# Patient Record
Sex: Male | Born: 1980 | Hispanic: No | Marital: Married | Smoking: Current every day smoker
Health system: Southern US, Community
[De-identification: ages and names within clinical notes are randomized; demographics above are authoritative.]

## PROBLEM LIST (undated history)

## (undated) HISTORY — PX: APPENDECTOMY: SHX54

---

## 2001-06-09 ENCOUNTER — Emergency Department (HOSPITAL_COMMUNITY): Admission: EM | Admit: 2001-06-09 | Discharge: 2001-06-09 | Payer: Self-pay | Admitting: Emergency Medicine

## 2001-06-09 ENCOUNTER — Encounter: Payer: Self-pay | Admitting: Emergency Medicine

## 2002-04-21 ENCOUNTER — Encounter: Payer: Self-pay | Admitting: Emergency Medicine

## 2002-04-21 ENCOUNTER — Inpatient Hospital Stay (HOSPITAL_COMMUNITY): Admission: EM | Admit: 2002-04-21 | Discharge: 2002-05-06 | Payer: Self-pay | Admitting: Emergency Medicine

## 2002-04-21 ENCOUNTER — Encounter (INDEPENDENT_AMBULATORY_CARE_PROVIDER_SITE_OTHER): Payer: Self-pay | Admitting: *Deleted

## 2002-04-27 ENCOUNTER — Encounter: Payer: Self-pay | Admitting: General Surgery

## 2003-05-07 ENCOUNTER — Encounter: Payer: Self-pay | Admitting: *Deleted

## 2003-05-07 ENCOUNTER — Emergency Department (HOSPITAL_COMMUNITY): Admission: EM | Admit: 2003-05-07 | Discharge: 2003-05-07 | Payer: Self-pay | Admitting: *Deleted

## 2004-04-04 ENCOUNTER — Emergency Department (HOSPITAL_COMMUNITY): Admission: EM | Admit: 2004-04-04 | Discharge: 2004-04-04 | Payer: Self-pay | Admitting: Emergency Medicine

## 2005-06-28 ENCOUNTER — Emergency Department (HOSPITAL_COMMUNITY): Admission: EM | Admit: 2005-06-28 | Discharge: 2005-06-28 | Payer: Self-pay | Admitting: Emergency Medicine

## 2005-10-19 ENCOUNTER — Emergency Department (HOSPITAL_COMMUNITY): Admission: EM | Admit: 2005-10-19 | Discharge: 2005-10-19 | Payer: Self-pay | Admitting: Emergency Medicine

## 2006-01-31 ENCOUNTER — Emergency Department (HOSPITAL_COMMUNITY): Admission: EM | Admit: 2006-01-31 | Discharge: 2006-01-31 | Payer: Self-pay | Admitting: Family Medicine

## 2007-10-03 ENCOUNTER — Emergency Department (HOSPITAL_COMMUNITY): Admission: EM | Admit: 2007-10-03 | Discharge: 2007-10-03 | Payer: Self-pay | Admitting: Family Medicine

## 2007-10-15 ENCOUNTER — Emergency Department (HOSPITAL_COMMUNITY): Admission: EM | Admit: 2007-10-15 | Discharge: 2007-10-15 | Payer: Self-pay | Admitting: Emergency Medicine

## 2008-08-03 ENCOUNTER — Emergency Department (HOSPITAL_COMMUNITY): Admission: EM | Admit: 2008-08-03 | Discharge: 2008-08-03 | Payer: Self-pay | Admitting: Family Medicine

## 2008-12-20 ENCOUNTER — Emergency Department (HOSPITAL_COMMUNITY): Admission: EM | Admit: 2008-12-20 | Discharge: 2008-12-20 | Payer: Self-pay | Admitting: Family Medicine

## 2009-11-18 ENCOUNTER — Emergency Department (HOSPITAL_COMMUNITY): Admission: EM | Admit: 2009-11-18 | Discharge: 2009-11-18 | Payer: Self-pay | Admitting: Emergency Medicine

## 2010-10-09 ENCOUNTER — Emergency Department (HOSPITAL_COMMUNITY): Admission: EM | Admit: 2010-10-09 | Discharge: 2010-07-20 | Payer: Self-pay | Admitting: Emergency Medicine

## 2010-10-20 ENCOUNTER — Emergency Department (HOSPITAL_COMMUNITY)
Admission: EM | Admit: 2010-10-20 | Discharge: 2010-10-20 | Payer: Self-pay | Source: Home / Self Care | Admitting: Emergency Medicine

## 2010-10-20 ENCOUNTER — Emergency Department (HOSPITAL_COMMUNITY)
Admission: EM | Admit: 2010-10-20 | Discharge: 2010-10-20 | Disposition: A | Discharge status: Other Acute Inpatient Hospital | Payer: Self-pay | Source: Home / Self Care | Admitting: Emergency Medicine

## 2010-10-22 ENCOUNTER — Emergency Department (HOSPITAL_COMMUNITY)
Admission: EM | Admit: 2010-10-22 | Discharge: 2010-10-22 | Payer: Self-pay | Source: Home / Self Care | Admitting: Emergency Medicine

## 2011-01-15 LAB — CBC
MCH: 33.4 pg (ref 26.0–34.0)
MCHC: 36 g/dL (ref 30.0–36.0)
MCV: 92.9 fL (ref 78.0–100.0)
Platelets: 219 10*3/uL (ref 150–400)
RBC: 4.49 MIL/uL (ref 4.22–5.81)

## 2011-01-15 LAB — DIFFERENTIAL
Basophils Absolute: 0 10*3/uL (ref 0.0–0.1)
Lymphocytes Relative: 26 % (ref 12–46)
Neutro Abs: 6 10*3/uL (ref 1.7–7.7)

## 2011-01-15 LAB — COMPREHENSIVE METABOLIC PANEL
Albumin: 3.9 g/dL (ref 3.5–5.2)
BUN: 8 mg/dL (ref 6–23)
Chloride: 104 mEq/L (ref 96–112)
Creatinine, Ser: 1.04 mg/dL (ref 0.4–1.5)
GFR calc non Af Amer: >60 mL/min (ref >60)
Total Bilirubin: 0.4 mg/dL (ref 0.3–1.2)

## 2011-01-15 LAB — POCT CARDIAC MARKERS
CKMB, poc: <1 ng/mL — ABNORMAL LOW (ref 1.0–8.0)
Troponin i, poc: <0.05 ng/mL (ref 0.00–0.09)

## 2011-03-20 NOTE — Discharge Summary (Signed)
NAME:  Jared Humphrey, Jared Humphrey NO.:  [id number]   MEDICAL RECORD NO.:  [id number]                   PATIENT TYPE:  INP   LOCATION:  5738                                 FACILITY:  [facility]   PHYSICIAN:  Adolph Pollack, M.D.            DATE OF BIRTH:  Aug 16, 1981   DATE OF ADMISSION:  04/21/2002  DATE OF DISCHARGE:  05/06/2002                                 DISCHARGE SUMMARY   PRINCIPAL DISCHARGE DIAGNOSIS:  Acute perforated appendicitis.   SECONDARY DIAGNOSES:  1. Postoperative intra-abdominal abscess.  2. Gastroesophageal reflux.   PROCEDURE:  1. Laparoscopic appendectomy April 21, 2002.  2. Exploratory laparotomy and drainage of intra-abdominal abscesses April 27, 2002.   REASON FOR ADMISSION:  The patient is a 30 year old male who the morning  before admission began developing periumbilical crampy-type pain that  radiated down to the right lower quadrant.  It progressively worsened.  He  hesitated to come into the hospital but eventually the pain got bad enough  that he did come to the hospital to seek attention.  He was seen by the  emergency department physician and the CT scan demonstrated findings looking  consistent with an appendicitis and he was admitted.   HOSPITAL COURSE:  He was taken to the operating room.  He was found to have  a ruptured appendix with a purulent pelvic fluid during his laparoscopic  appendectomy.  The cavity was irrigated copiously.  He was maintained on IV  antibiotics.  He had a fair amount of pain afterwards but began to improve;  in fact, he left the unit to smoke multiple times.  He remained afebrile  with a normal white blood cell count however, his sixth postoperative day he  began spiking fevers again.  CT scan demonstrated multiple areas of  loculated fluid collections consistent with abscess and his white blood cell  count was noted to be elevated.  He subsequently was taken back to the  operating room April 27, 2002 where multiple intra-abdominal abscesses were  drained and three #19 Blake drains were placed.  Postoperatively again, he  was maintained on IV Zosyn.  His wound was left to heal by secondary  intention and dressing changes were started.  He began feeling better and  having less pain.  He defervesced.  He began having bowel movements and he  had a regular diet.  His cultures were sterile with respect to growth;  however, he improved dramatically after the second operation.  We slowly  started removing his drains until on May 06, 2002 the drains were all out,  wound was healing well and he was able to be discharged.   DISPOSITION:  Discharged home May 06, 2002.  He was taught how to change his  dressings and he felt comfortable with this.  He was given Tylox for pain,  Augmentin for antibiotic.  He is due to see me in a  few days for followup  appointment.  He was told to call for any problems.                                                Adolph Pollack, M.D.    Kari Baars  D:  05/30/2002  T:  06/02/2002  Job:  [id number]

## 2011-03-20 NOTE — Op Note (Signed)
[place]. [facility]  Patient:    Jared Humphrey, Jared Humphrey Visit Number: [id number] MRN: [id number]          Service Type: MED Location: 5000 [id number] Attending Physician:  Arlis Porta Dictated by:   Adolph Pollack, M.D. Proc. Date: 04/21/02 Admit Date:  04/21/2002                             Operative Report  PREOPERATIVE DIAGNOSIS:  Acute appendicitis.  POSTOPERATIVE DIAGNOSIS:  Acute perforated appendicitis.  PROCEDURE:  Laparoscopic appendectomy.  SURGEON: Adolph Pollack, M.D.  ANESTHESIA:  General.  INDICATION:  Mr. Strine is a 30 year old male who began having abdominal pain yesterday.  The pain presented in his periumbilical area, then lower abdomen, and now mostly in the right lower quadrant.  He presented to the emergency department, where he was noted to have a high white blood count, and a CT scan demonstrated findings consistent with acute appendicitis.  He now presents for elective appendectomy.  DESCRIPTION OF PROCEDURE:  He was placed supine on the operating table, and a general anesthetic was administered.  A Foley catheter was placed in the bladder.  The abdomen was shaved and sterilely prepped and draped.  A subumbilical incision was made, incising the skin sharply and the subcutaneous tissue divided bluntly.  The midline fascia was identified and a small incision made in that.  The peritoneal cavity was then entered bluntly under direct vision.  A pursestring suture of 0 Vicryl was placed around the fascial edges.  A Hasson trocar was introduced into the peritoneal cavity and pneumoperitoneum created by insufflation of CO2 gas.  Next the laparoscope was introduced.  There was purulent fluid noted in the pelvis.  This made me suspicious for a small perforation.  Under direct vision a 5 mm trocar was placed in the lower midline, and I began manipulating the cecal area and noted an inflamed, indurated appendix  with gangrenous-type changes and fibrinous debris.  I felt these findings were probably consistent with a microperforation.  I then used blunt dissection to carefully mobilized this free of the ileum and the lateral sidewall.  I then placed a 5 mm trocar in the right upper quadrant, grasped the mesoappendix, and retracted it anteriorly.  I used the Harmonic scalpel to divide the mesoappendix down to the base of the appendix.  The appendix was then amputated from the cecum using the Endo-GIA stapler.  The staple line was hemostatic and solid.  I placed the appendix in the Endopouch bag and on initial attempts to try to bring it out through the subumbilical port, the bag broke.  I retrieved the appendix and put it into a second Endopouch bag and was able to remove it at this time.  I subsequently copiously irrigated out the abdominal cavity with 1.8 L of normal saline solution and evacuated it.  I re-examined the cecal area and again the staple line was solid without bleeding.  Under direct vision I then removed the Hasson trocar and closed the fascial defect by tightening up and tying down the pursestring suture.  The pneumoperitoneum was released and the remaining trocars removed.  The skin incisions were then closed with 4-0 Monocryl subcuticular stitches. Side-to-side and sterile dressings were applied.  He tolerated the procedure well without any apparent complications.  He was taken to recovery in satisfactory condition. Dictated by:   Adolph Pollack, M.D. Attending Physician:  Arlis Porta DD:  04/21/02 TD:  04/24/02 Job: [id number]

## 2011-03-20 NOTE — H&P (Signed)
[place]. [facility]  Patient:    Jared Humphrey, Jared Humphrey Visit Number: [id number] MRN: [id number]          Service Type: MED Location: 5000 5002 [id number] Attending Physician:  Arlis Porta Dictated by:   Adolph Pollack, M.D. Admit Date:  04/21/2002                           History and Physical  CHIEF COMPLAINT: Abdominal pain.  HISTORY OF PRESENT ILLNESS: Jared Humphrey is a 30 year old male, who mid morning yesterday began developing some periumbilical crampy type pain that radiated to the lower quadrants and then down to the right lower quadrant. The pain persisted and got a little worse.  He made himself throw up but got no relief.  He had had no diarrhea and no dysuria.  The pain continued to increase and he presented to the emergency department for evaluation.  His evaluation included a CT scan of his abdomen, with findings consistent with appendicitis, and I was called to see him because of this.  PAST MEDICAL HISTORY: Gastroesophageal reflux.  PAST SURGICAL HISTORY: Left knee surgery.  ALLERGIES: None known.  MEDICATIONS: Prilosec p.r.n.  SOCIAL HISTORY: He is single.  He smokes a pack of cigarettes a day.  He denies tobacco use.  Self-employed.  FAMILY HISTORY: Noncontributory.  REVIEW OF SYSTEMS: CARDIOVASCULAR: No known heart disease or hypertension. PULMONARY: Denies asthma, pneumonia.  GI: Denies hepatitis, peptic ulcer disease, diverticulitis.  GU: Denies kidney stones.  ENDOCRINE: Denies diabetes.  HEMATOLOGIC: Denies bleeding disorders, transfusions.  NEUROLOGIC: Denies seizure disorder.  PHYSICAL EXAMINATION:  GENERAL: Slightly overweight male in no acute distress.  Pleasant and cooperative.  VITAL SIGNS: Temperature at 10:20 a.m. was 99.3 degrees with a pulse of 83 and blood pressure of 148/72.  SKIN: Warm and dry.  No rash.  HEENT: Normocephalic, atraumatic.  EOMI.  Sclerae clear.  NECK: Supple without palpable  masses.  CARDIOVASCULAR: Heart demonstrates regular rate and rhythm with no murmur.  RESPIRATORY: Breath sounds equal and clear.  Respirations unlabored.  ABDOMEN: Soft.  There is right lower quadrant tenderness to palpation, percussion with some mild guarding.  Active bowel sounds are noted.  No masses noted.  EXTREMITIES: No cyanosis or edema and full range of motion.  LABORATORY DATA: WBC 20,200, hemoglobin 14.9, platelet count 221,000.  UA was negative.  CMET is remarkable for a total bilirubin of 1.5.  IMPRESSION: Acute appendicitis.  PLAN: IV antibiotics and laparoscopic appendectomy.  I have explained the procedure and the risks to him.  Risks include, but not limited to, bleeding, infection, accidental damage to intra-abdominal organs such as intestine or bladder, and risks of anesthesia.  He seems to understand this and agrees to proceed. Dictated by:   Adolph Pollack, M.D. Attending Physician:  Arlis Porta DD:  04/21/02 TD:  04/24/02 Job: [id number]

## 2011-03-20 NOTE — Op Note (Signed)
. [facility]  Patient:    Jared Humphrey, Jared Humphrey Visit Number: [id number] MRN: [id number]          Service Type: MED Location: [id number] Attending Physician:  Arlis Porta Dictated by:   Adolph Pollack, M.D. Proc. Date: 04/27/02 Admit Date:  04/21/2002                             Operative Report  PREOPERATIVE DIAGNOSIS:  Intra-abdominal abscesses.  POSTOPERATIVE DIAGNOSIS:  Intra-abdominal abscesses.  PROCEDURE:  Exploratory laparotomy and drainage of intra-abdominal abscesses.  SURGEON:  Adolph Pollack, M.D.  ANESTHESIA:  General.  INDICATIONS:  The patient is a 30 year old male who underwent a laparoscopic appendectomy for a perforated appendicitis.  He had a slow postoperative course, but was beginning to get better until yesterday when he had increasing pain, fever, and then elevation of his white blood cell count today.  CT scan demonstrated multiple fluid collections which were loculated consistent with abscesses.  These were not amenable to percutaneous drainage.  He is now brought to the operating room for exploration and open drainage.  FINDINGS:  There were approximately four pockets of bloody thin fluid.  One in the right lower quadrant and one in the left lower quadrant, one in the pelvis, and one in an interloop area of the small intestine.  There was fibrinous debris all through the small intestine and in the right lower quadrant area.  Cultures were sent.  DESCRIPTION OF PROCEDURE:  He was placed supine on the operating table and a general anesthetic was administered.  A Foley catheter was placed in the bladder.  His abdomen was sterilely prepped and draped.  A lower midline incision was made, incising the skin and subcutaneous tissue down to the level of the fascia.  The midline fascia was then divided with the cautery.  The peritoneal cavity was entered under direct vision.  Upon entering into  the peritoneal cavity, I placed my hand into the right lower quadrant and evacuated a lot of thin bloody-type fluid.  Fibrinous debris and inflammatory changes was noted.  I subsequently placed my hand just anterior to the rectum, and again, drained a fair amount of thin bloody fluid.  I placed my hand in the left lower quadrant, drained a thin amount of bloody fluid.  I sent this fluid for culture.  Next, I noticed fibrinous debris, and interloop fluid collections between the small intestine and the lower abdomen which I broke up.  I debrided some of the fibrinous debris from the small intestine.  I examined the appendiceal stump and there was no leak from this.  There was no bile injury noted.  Next, I copiously irrigated out the abdomen with 4.5 L of saline solution.  I then made two stab wounds in the right lower quadrant.  I placed a 19 Blake drain into the right lower quadrant pericecal area.  I anchored into the skin with a 3-0 nylon suture.  I then placed a second 19 Blake drain in the retropubic space just anterior to the rectum.  I anchored into a skin with a 3-0 nylon suture.  I then made a stab wound in the left lower quadrant and placed a 19 Blake drain in the left lower quadrant area and the left gutter. I anchored it to the skin with a 3-0 Vicryl suture.  Next, when sponge, instrument, and needle counts were reported to  be correct, I closed the fascia with a running #1 PDS suture.  The subcutaneous tissue was irrigated with antibiotic solution.  I then partially closed the skin with staples, and then packed the rest of the wound open.  Bulky dressings were applied.  He tolerated the procedure well without any apparent complications.  He was taken to recovery room in satisfactory condition. Dictated by:   Adolph Pollack, M.D. Attending Physician:  Arlis Porta DD:  04/27/02 TD:  04/30/02 Job: [id number]

## 2011-11-09 ENCOUNTER — Encounter: Payer: Self-pay | Admitting: Emergency Medicine

## 2011-11-09 ENCOUNTER — Other Ambulatory Visit: Payer: Self-pay

## 2011-11-09 ENCOUNTER — Emergency Department (HOSPITAL_COMMUNITY): Payer: Self-pay

## 2011-11-09 ENCOUNTER — Emergency Department (HOSPITAL_COMMUNITY)
Admission: EM | Admit: 2011-11-09 | Discharge: 2011-11-09 | Disposition: A | Discharge status: Home / Self Care | Payer: Self-pay | Attending: Emergency Medicine | Admitting: Emergency Medicine

## 2011-11-09 DIAGNOSIS — R1110 Vomiting, unspecified: Secondary | ICD-10-CM | POA: Insufficient documentation

## 2011-11-09 DIAGNOSIS — M7989 Other specified soft tissue disorders: Secondary | ICD-10-CM | POA: Insufficient documentation

## 2011-11-09 DIAGNOSIS — R000 Tachycardia, unspecified: Secondary | ICD-10-CM | POA: Insufficient documentation

## 2011-11-09 DIAGNOSIS — M79609 Pain in unspecified limb: Secondary | ICD-10-CM | POA: Insufficient documentation

## 2011-11-09 DIAGNOSIS — R079 Chest pain, unspecified: Secondary | ICD-10-CM | POA: Insufficient documentation

## 2011-11-09 NOTE — ED Notes (Signed)
Drinda Butts unable to take report at present

## 2011-11-09 NOTE — ED Provider Notes (Signed)
History     CSN: [id number]  Arrival date & time 11/09/11  8413   First MD Initiated Contact with Patient 11/09/11 0805      Chief Complaint  Patient presents with  . Chest Pain    (Consider location/radiation/quality/duration/timing/severity/associated sxs/prior treatment) HPI  31 year old male presenting to the ED with chief complaints of chest pain. Patient describes sharp, pleuritic chest pain which wakes him up this morning. He vomited once due to pain. Patient states pain is now achy, worsening with deep breath.  Pain resides in his left anterior chest, and nonradiating.  Patient denies fever, cough, abdominal pain, diarrhea, dysuria, recent trauma. He denies alcohol or recreational drug use. Patient mentioned he has history of heart palpitation. Does have a family history of heart disease. Patient also mention that he has been noticing pain and swelling to his right lower extremities as compared to left lower extremities. However, he has noticed it for the past year. He denies any recent surgery, prolonged travel, or prolonged bed rest.  No past medical history on file.  No past surgical history on file.  No family history on file.  History  Substance Use Topics  . Smoking status: Not on file  . Smokeless tobacco: Not on file  . Alcohol Use: Not on file      Review of Systems  All other systems reviewed and are negative.    Allergies  Review of patient's allergies indicates no known allergies.  Home Medications   Current Outpatient Rx  Name Route Sig Dispense Refill  . IBUPROFEN 200 MG PO TABS Oral Take 400 mg by mouth every 6 (six) hours as needed. For pain       BP 137/75  Pulse 100  Temp(Src) 98.8 F (37.1 C) (Oral)  Resp 20  Ht 6\' 2"  (1.88 m)  Wt 285 lb (129.275 kg)  BMI 36.59 kg/m2  SpO2 96%  Physical Exam  Nursing note and vitals reviewed. Constitutional: He appears well-developed and well-nourished. No distress.       Awake, alert, nontoxic  appearance  HENT:  Head: Normocephalic and atraumatic.  Eyes: Right eye exhibits no discharge. Left eye exhibits no discharge.  Neck: Neck supple.  Cardiovascular: Tachycardia present.  Exam reveals no gallop and no friction rub.   No murmur heard. Pulmonary/Chest: Effort normal and breath sounds normal. No respiratory distress. He has no wheezes. He has no rales. He exhibits no tenderness.    Abdominal: Soft. There is no tenderness. There is no rebound.  Musculoskeletal: He exhibits no tenderness.       Baseline ROM, no obvious new focal weakness  Neurological:       Mental status and motor strength appears baseline for patient and situation  Skin: No rash noted.  Psychiatric: He has a normal mood and affect.    ED Course  Procedures (including critical care time)  Labs Reviewed - No data to display No results found.   No diagnosis found.   Date: 11/09/2011  Rate: 106  Rhythm: sinus tachycardia  QRS Axis: normal  Intervals: normal  ST/T Wave abnormalities: normal  Conduction Disutrbances:none  Narrative Interpretation:   Old EKG Reviewed: unchanged    MDM  Patient presents with signs and symptoms concerning for PE. I will obtain EKG, 2 sets of troponins, d-dimer, and chest x-ray for further evaluation. Patient is currently in no acute distress. He is afebrile. He is mildly tachycardic, with increased respiratory rate but in no acute respiratory distress.  9:29 AM  Chest x-ray shows bronchitic changes without obvious evidence of pneumonia,  Or other acute changes.  Patient has a negative d-dimer. Although patient complains of lower extremity pain and swelling, he also mention that his symptom has been ongoing for the past year. It is unlikely that he has a DVT at this time. Patient will be sent to the CDU while waiting for a second set of cardiac enzymes. Cardiology will consult for outpatient followup. I discussed patient's care with my attending has seen and evaluate  patient and agree with plan.  Trixie Dredge, PA-C will continue further pt care in CDU.    11:15 AM I have consulted with [facility] to set up a follow up appointment. [facility] will call pt to set up follow up.      Fayrene Helper, PA 11/09/11 0934    Fayrene Helper, PA 11/09/11 1116

## 2011-11-09 NOTE — ED Notes (Signed)
Pt reports he was awoken out of his sleep this AM around 545 with L sided chest pain, sharp in nature. Denies radiation. Vomited x1 this AM.

## 2011-11-09 NOTE — ED Provider Notes (Signed)
Medical screening examination/treatment/procedure(s) were conducted as a shared visit with non-physician practitioner(s) and myself.  I personally evaluated the patient during the encounter  Doug Sou, MD 11/09/11 1221

## 2011-11-09 NOTE — ED Provider Notes (Signed)
Complains of skipped heartbeats for the past several weeks, today patient awakened with sharp pleuritic chest pain covering approximately 3 cm area under left breast nonradiating pain worse with deep inspiration not improved by anything constant mild at present also complains of swelling and pain in right calf for past several weeks on exam alert nontoxic Glasgow Coma Score 15 lungs clear to auscultation heart regular rate and rhythm no murmurs abdomen nondistended nontender extremities without edema no cords no Homans sign neurovascular  Doug Sou, MD 11/09/11 1740

## 2013-03-07 IMAGING — CR DG CHEST 2V
2 series · 2 of 2 positions shown · non-contrast
Comparison: 07/20/2010

CLINICAL DATA: Chest pain.

CHEST - 2 VIEW

[w chest pa]
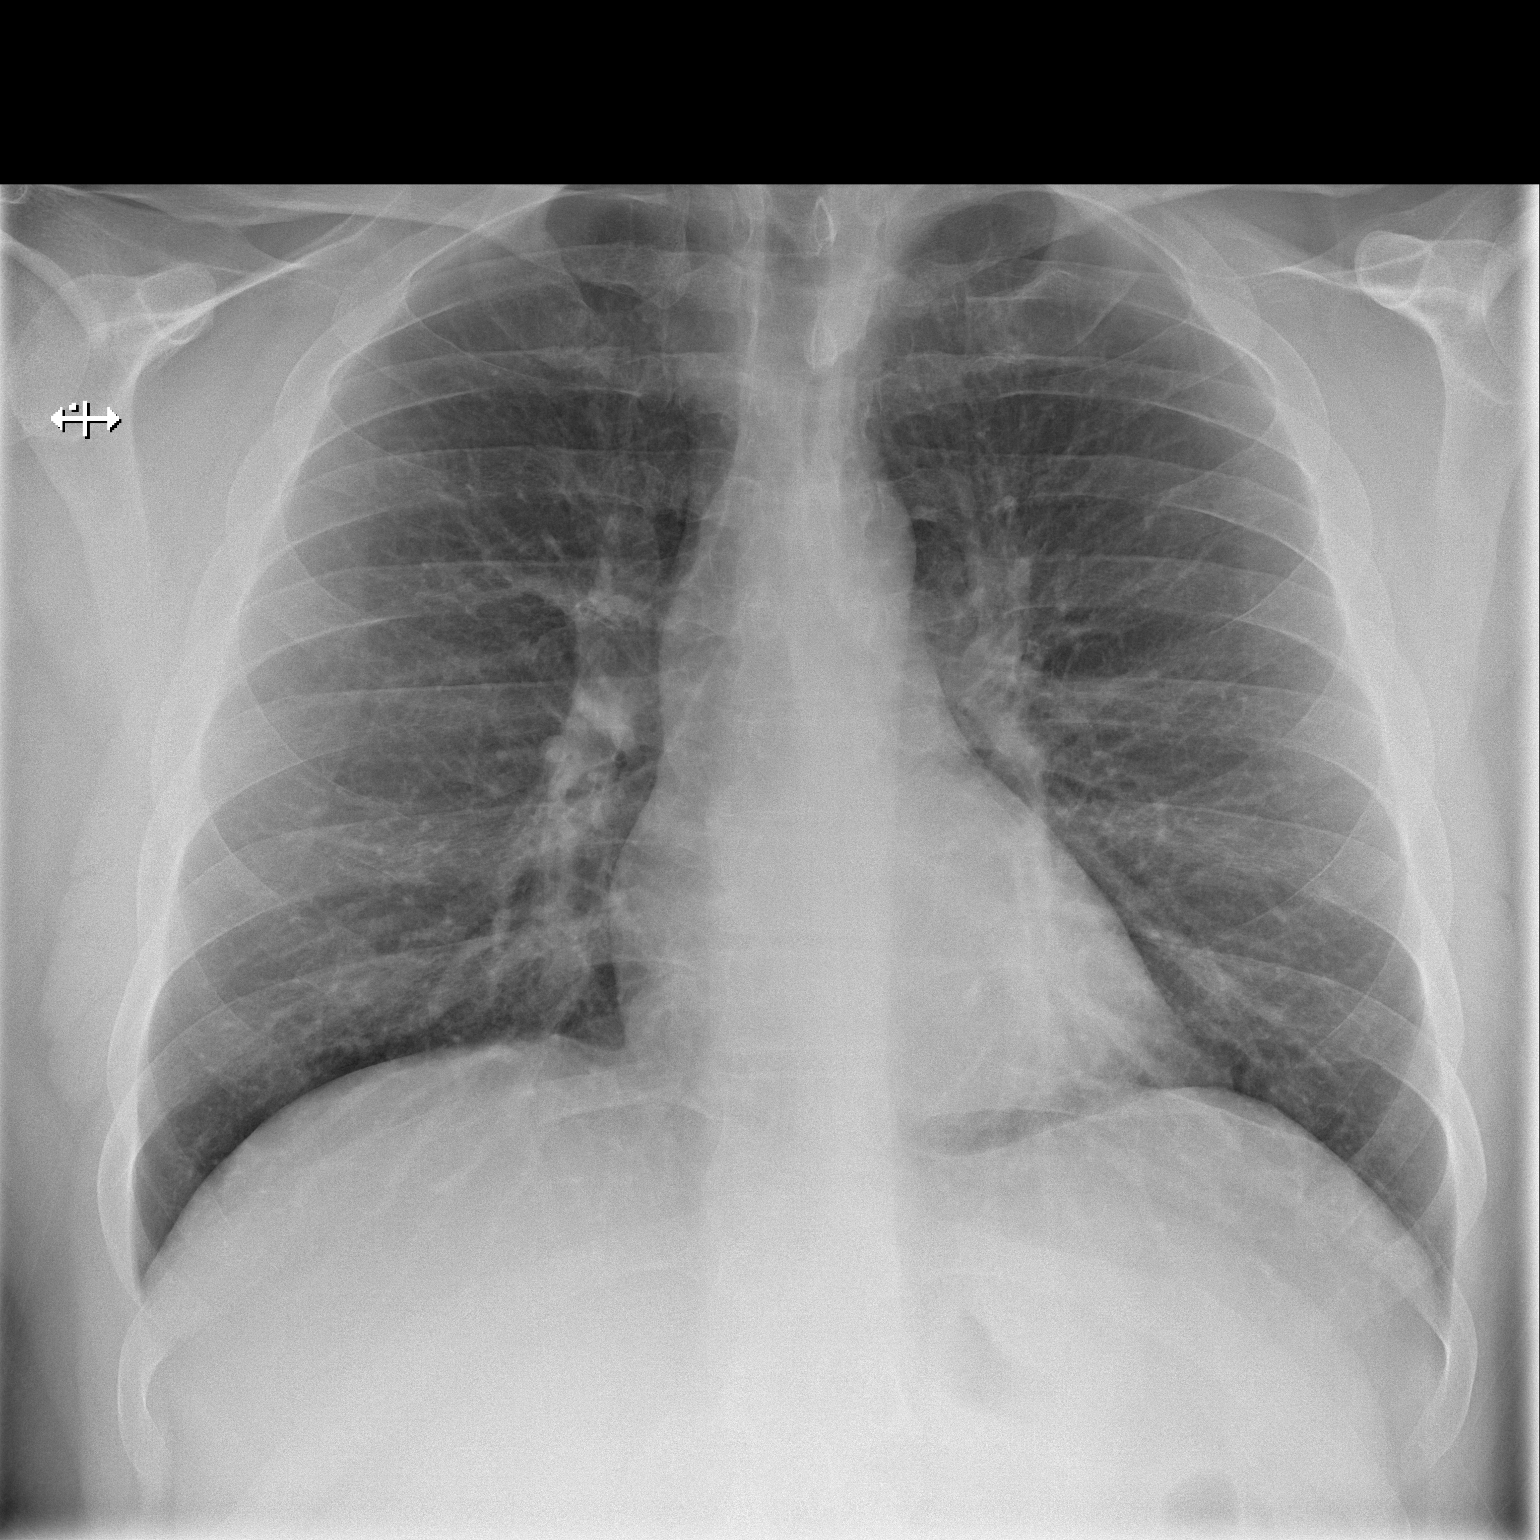

[w chest lat]
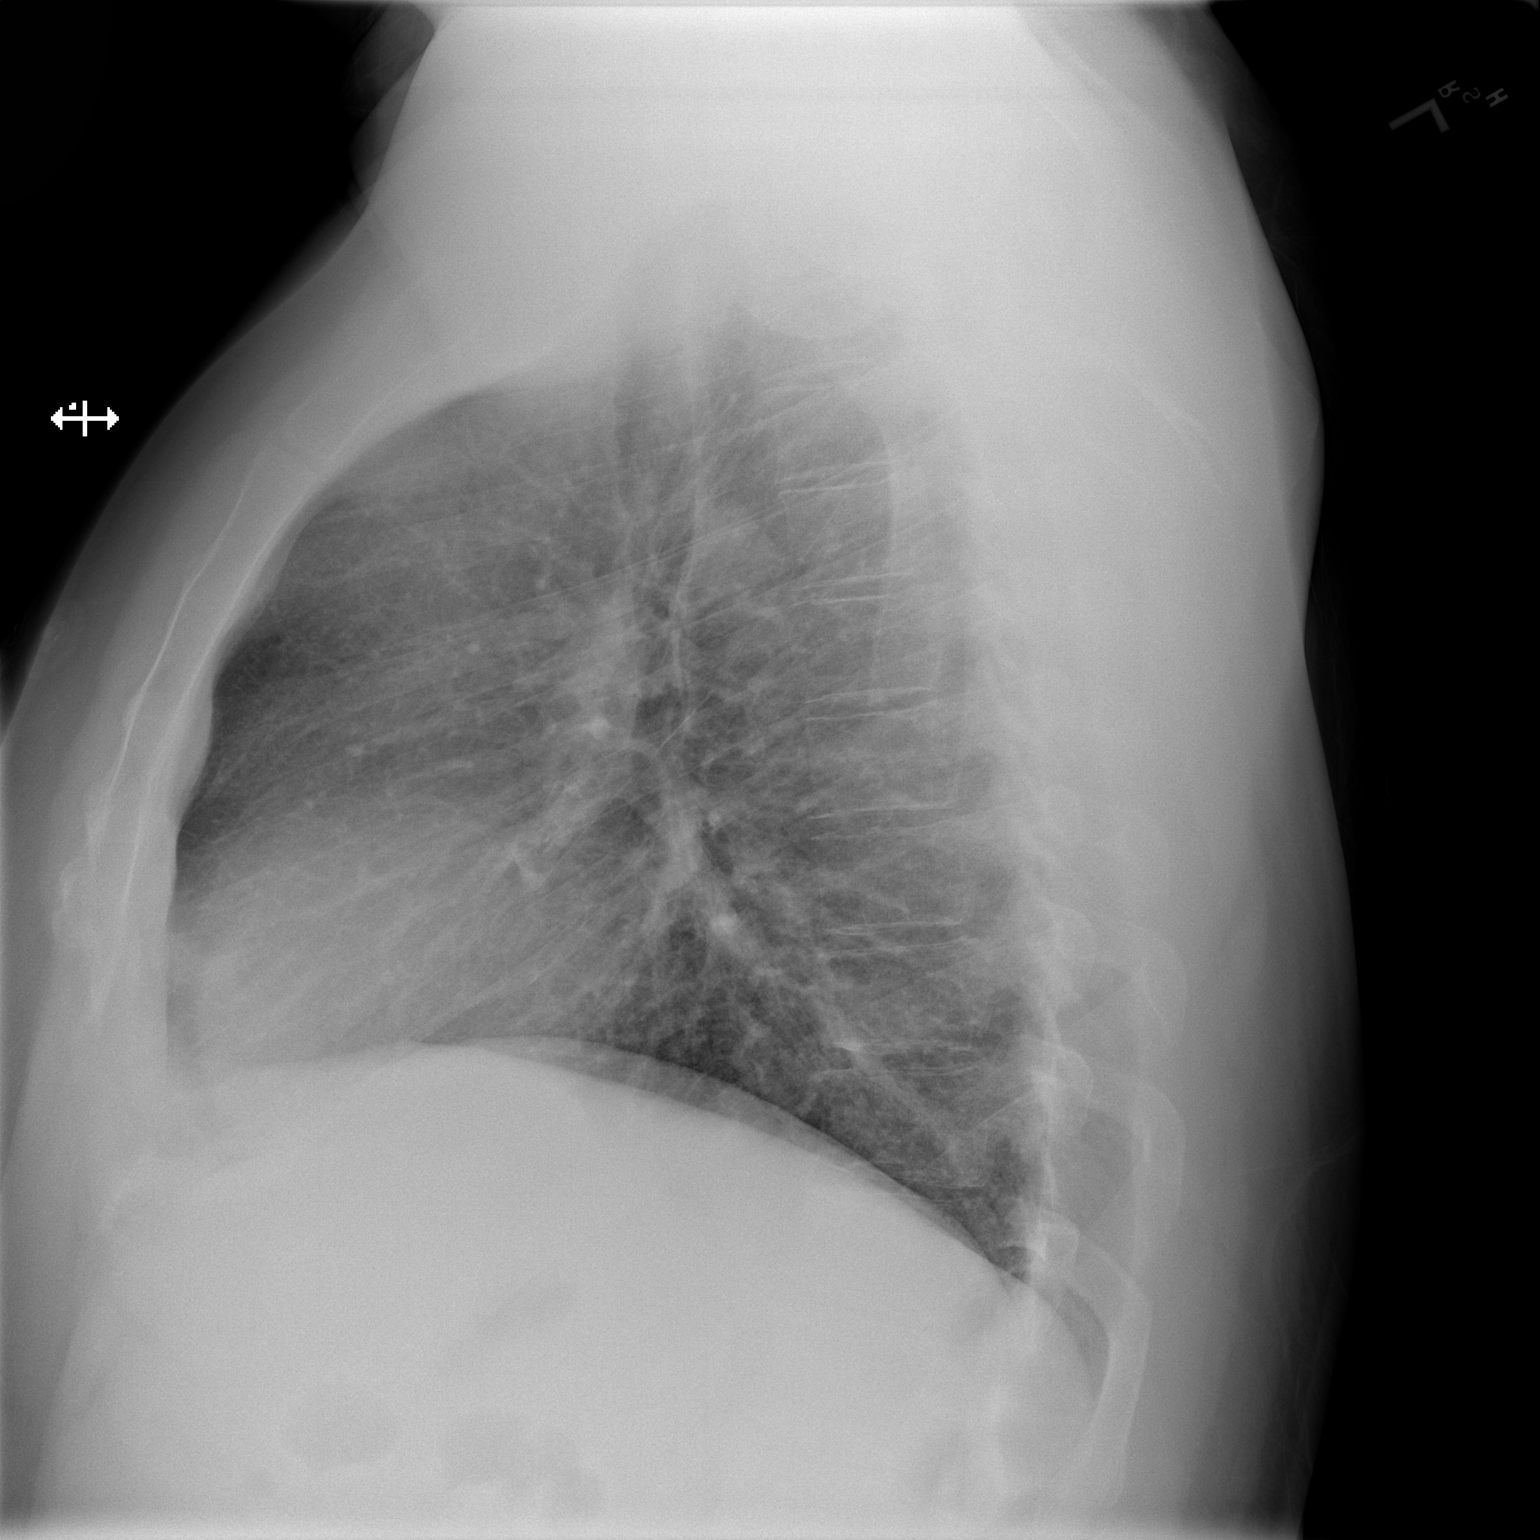

[2 of 2 positions shown; findings below may reference images not displayed]

FINDINGS: Heart and mediastinal contours are within normal limits.
No focal opacities or effusions.  No acute bony abnormality.
Slight peribronchial thickening.
IMPRESSION: Bronchitic changes.

## 2013-03-30 ENCOUNTER — Emergency Department (HOSPITAL_COMMUNITY): Payer: Worker's Compensation

## 2013-03-30 ENCOUNTER — Encounter (HOSPITAL_COMMUNITY): Payer: Self-pay | Admitting: *Deleted

## 2013-03-30 ENCOUNTER — Emergency Department (HOSPITAL_COMMUNITY)
Admission: EM | Admit: 2013-03-30 | Discharge: 2013-03-30 | Disposition: A | Discharge status: Home / Self Care | Payer: Worker's Compensation | Attending: Emergency Medicine | Admitting: Emergency Medicine

## 2013-03-30 DIAGNOSIS — Y9289 Other specified places as the place of occurrence of the external cause: Secondary | ICD-10-CM | POA: Insufficient documentation

## 2013-03-30 DIAGNOSIS — Y990 Civilian activity done for income or pay: Secondary | ICD-10-CM | POA: Insufficient documentation

## 2013-03-30 DIAGNOSIS — M545 Low back pain, unspecified: Secondary | ICD-10-CM

## 2013-03-30 DIAGNOSIS — IMO0002 Reserved for concepts with insufficient information to code with codable children: Secondary | ICD-10-CM | POA: Insufficient documentation

## 2013-03-30 DIAGNOSIS — R209 Unspecified disturbances of skin sensation: Secondary | ICD-10-CM | POA: Insufficient documentation

## 2013-03-30 DIAGNOSIS — S8990XA Unspecified injury of unspecified lower leg, initial encounter: Secondary | ICD-10-CM | POA: Insufficient documentation

## 2013-03-30 DIAGNOSIS — W1789XA Other fall from one level to another, initial encounter: Secondary | ICD-10-CM | POA: Insufficient documentation

## 2013-03-30 DIAGNOSIS — F17200 Nicotine dependence, unspecified, uncomplicated: Secondary | ICD-10-CM | POA: Insufficient documentation

## 2013-03-30 DIAGNOSIS — T148XXA Other injury of unspecified body region, initial encounter: Secondary | ICD-10-CM

## 2013-03-30 DIAGNOSIS — Y9389 Activity, other specified: Secondary | ICD-10-CM | POA: Insufficient documentation

## 2013-03-30 MED ORDER — DIAZEPAM 5 MG PO TABS: 5.0000 mg | ORAL_TABLET | Freq: Two times a day (BID) | ORAL | Status: DC

## 2013-03-30 MED ORDER — OXYCODONE-ACETAMINOPHEN 5-325 MG PO TABS
2.0000 | ORAL_TABLET | Freq: Once | ORAL | Status: AC
  Administered 2013-03-30: 2 via ORAL
  Filled 2013-03-30: qty 2

## 2013-03-30 MED ORDER — IBUPROFEN 800 MG PO TABS: 800.0000 mg | ORAL_TABLET | Freq: Three times a day (TID) | ORAL | Status: DC

## 2013-03-30 MED ORDER — OXYCODONE-ACETAMINOPHEN 5-325 MG PO TABS: ORAL_TABLET | ORAL | Status: DC

## 2013-03-30 NOTE — ED Provider Notes (Signed)
History  This chart was scribed for non-physician practitioner, Hector Shade, working with Dione Booze, MD by Ardeen Jourdain, ED Scribe. This patient was seen in room TR11C/TR11C and the patient's care was started at 1735.  CSN: [id number]  Arrival date & time 03/30/13  1543   First MD Initiated Contact with Patient 03/30/13 1735      Chief Complaint  Patient presents with  . Fall  . Back Pain     The history is provided by the patient. No language interpreter was used.    HPI Comments: Jared Humphrey is a 32 y.o. male who presents to the Emergency Department complaining of a fall that occurred a few hours ago. Pt is c/o sudden onset, gradually worsening, constant lower back pain. He states he was at work when he fell backwards 5 feet into a concrete wall. Pt states he fell "square on my lower back." Pt denies any head trauma or LOC. Pt states the pain radiates into his legs. He denies any other injuries at this time. Pt is able to ambulate. He states his bilateral legs were initially numb but the feeling has returned.    History reviewed. No pertinent past medical history.  Past Surgical History  Procedure Laterality Date  . Appendectomy      History reviewed. No pertinent family history.  History  Substance Use Topics  . Smoking status: Current Every Day Smoker -- 0.50 packs/day    Types: Cigarettes  . Smokeless tobacco: Not on file  . Alcohol Use: No     Comment: socially      Review of Systems  Musculoskeletal: Positive for back pain.  All other systems reviewed and are negative.    Allergies  Hydrocodone  Home Medications   Current Outpatient Rx  Name  Route  Sig  Dispense  Refill  . diazepam (VALIUM) 5 MG tablet   Oral   Take 1 tablet (5 mg total) by mouth 2 (two) times daily.   10 tablet   0   . ibuprofen (ADVIL,MOTRIN) 800 MG tablet   Oral   Take 1 tablet (800 mg total) by mouth 3 (three) times daily.   21 tablet   0   .  oxyCODONE-acetaminophen (PERCOCET/ROXICET) 5-325 MG per tablet      Take 1-2 pills every 6 hours as needed for pain.   6 tablet   0     Triage Vitals: BP 135/76  Pulse 92  Temp(Src) 98.1 F (36.7 C)  Resp 18  SpO2 98%  Physical Exam  Nursing note and vitals reviewed. Constitutional: He is oriented to person, place, and time. He appears well-developed and well-nourished. No distress.  HENT:  Head: Normocephalic and atraumatic.  Eyes: EOM are normal. Pupils are equal, round, and reactive to light.  Neck: Normal range of motion. Neck supple. No tracheal deviation present.  Cardiovascular: Normal rate, regular rhythm and normal heart sounds.  Exam reveals no gallop and no friction rub.   No murmur heard. Pulmonary/Chest: Effort normal and breath sounds normal. No respiratory distress. He has no wheezes. He has no rales. He exhibits no tenderness.  Abdominal: Soft. Bowel sounds are normal. He exhibits no distension. There is no tenderness.  Musculoskeletal: Normal range of motion. He exhibits tenderness. He exhibits no edema.  TTP across lower lumbar spine on left and right side. Muscular tenderness. No step offs or crepitance. Pain with movement. Skin intact. No erythema or ecchymosis   Neurological: He is alert and oriented  to person, place, and time.  Skin: Skin is warm and dry. He is not diaphoretic.  Psychiatric: He has a normal mood and affect. His behavior is normal.    ED Course  Procedures (including critical care time)  DIAGNOSTIC STUDIES: Oxygen Saturation is 98% on room air, normal by my interpretation.    COORDINATION OF CARE:  5:50 PM-Discussed treatment plan which includes pain medication and x-ray of lumbar spine with pt at bedside and pt agreed to plan.    Labs Reviewed - No data to display Dg Lumbar Spine Complete  03/30/2013   *RADIOLOGY REPORT*  Clinical Data: Fall from 5 feet height.  Low back injury and pain.  LUMBAR SPINE - COMPLETE 4+ VIEW  Comparison:   None.  Findings:  There is no evidence of lumbar spine fracture. Alignment is normal.  Intervertebral disc spaces are maintained.  IMPRESSION: Negative.   Original Report Authenticated By: Myles Rosenthal, M.D.     1. LBP (low back pain)   2. Bone bruise       MDM  Pt fell onto his lower back while at work, hitting a concrete retaining wall.  Pt ambulatory since fall.  Denies hitting head or LOC.  Pain is in lower back and tailbone. No numbness in legs or groin at this time. Antalgic gait, TTP over lumber and sacral spine and paraspinal muscles, worse with movement. Plain films: no fracture. Tx in ED: percocet  Rx: norco, valium and ibuprofen.  F/u with PCP next week as needed for pain.  Advised pt to use ice tonight and tomorrow, may switch to head for comfort in 48 hours.  Provided pt education packet on back pain. Provided work note for tomorrow.  Advised pt to refrain from heavy lifting this weekend. Vitals: unremarkable. Discharged in stable condition.    I personally performed the services described in this documentation, which was scribed in my presence. The recorded information has been reviewed and is accurate.     Junius Finner, PA-C 03/31/13 1527

## 2013-03-30 NOTE — ED Notes (Signed)
Pt has ride home.

## 2013-03-30 NOTE — ED Notes (Signed)
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 Now c/o lower back pain and upper thighs, pt having difficulty walking. Pt in nad, skin warm and dry, resp e/u.

## 2013-03-30 NOTE — ED Notes (Signed)
Returned from radiology. 

## 2013-03-30 NOTE — ED Notes (Signed)
Pt reports falling backwards from a standing position, onto a concrete retaining wall.  Pt has ambulated since the fall, states that the numbness in his bilateral legs is gone.  Reports pain in lower back and tailbone.

## 2013-04-01 NOTE — ED Provider Notes (Signed)
Medical screening examination/treatment/procedure(s) were performed by non-physician practitioner and as supervising physician I was immediately available for consultation/collaboration.  Tarik Teixeira, MD 04/01/13 0031 

## 2014-07-13 ENCOUNTER — Emergency Department (HOSPITAL_COMMUNITY): Payer: Self-pay

## 2014-07-13 ENCOUNTER — Emergency Department (HOSPITAL_COMMUNITY)
Admission: EM | Admit: 2014-07-13 | Discharge: 2014-07-13 | Disposition: A | Discharge status: Home / Self Care | Payer: Self-pay | Attending: Emergency Medicine | Admitting: Emergency Medicine

## 2014-07-13 ENCOUNTER — Encounter (HOSPITAL_COMMUNITY): Payer: Self-pay | Admitting: Emergency Medicine

## 2014-07-13 DIAGNOSIS — J159 Unspecified bacterial pneumonia: Secondary | ICD-10-CM | POA: Insufficient documentation

## 2014-07-13 DIAGNOSIS — R509 Fever, unspecified: Secondary | ICD-10-CM | POA: Insufficient documentation

## 2014-07-13 DIAGNOSIS — R51 Headache: Secondary | ICD-10-CM | POA: Insufficient documentation

## 2014-07-13 DIAGNOSIS — R1110 Vomiting, unspecified: Secondary | ICD-10-CM | POA: Insufficient documentation

## 2014-07-13 DIAGNOSIS — J189 Pneumonia, unspecified organism: Secondary | ICD-10-CM

## 2014-07-13 DIAGNOSIS — F17200 Nicotine dependence, unspecified, uncomplicated: Secondary | ICD-10-CM | POA: Insufficient documentation

## 2014-07-13 LAB — CBC
HCT: 47.8 % (ref 39.0–52.0)
HEMOGLOBIN: 16.9 g/dL (ref 13.0–17.0)
MCH: 33.5 pg (ref 26.0–34.0)
MCHC: 35.4 g/dL (ref 30.0–36.0)
MCV: 94.7 fL (ref 78.0–100.0)
PLATELETS: 208 10*3/uL (ref 150–400)
RBC: 5.05 MIL/uL (ref 4.22–5.81)
RDW: 12.7 % (ref 11.5–15.5)
WBC: 11.7 10*3/uL — AB (ref 4.0–10.5)

## 2014-07-13 LAB — COMPREHENSIVE METABOLIC PANEL
ALK PHOS: 69 U/L (ref 39–117)
ALT: 29 U/L (ref 0–53)
ANION GAP: 16 — AB (ref 5–15)
AST: 24 U/L (ref 0–37)
Albumin: 4.4 g/dL (ref 3.5–5.2)
BILIRUBIN TOTAL: 0.3 mg/dL (ref 0.3–1.2)
BUN: 11 mg/dL (ref 6–23)
CALCIUM: 10.4 mg/dL (ref 8.4–10.5)
CHLORIDE: 96 meq/L (ref 96–112)
CO2: 25 meq/L (ref 19–32)
Creatinine, Ser: 0.84 mg/dL (ref 0.50–1.35)
GFR calc Af Amer: >90 mL/min (ref >90)
GFR calc non Af Amer: >90 mL/min (ref >90)
GLUCOSE: 90 mg/dL (ref 70–99)
POTASSIUM: 4.3 meq/L (ref 3.7–5.3)
SODIUM: 137 meq/L (ref 137–147)
Total Protein: 8.5 g/dL — ABNORMAL HIGH (ref 6.0–8.3)

## 2014-07-13 MED ORDER — DIPHENHYDRAMINE HCL 50 MG/ML IJ SOLN
25.0000 mg | Freq: Once | INTRAMUSCULAR | Status: AC
  Administered 2014-07-13: 25 mg via INTRAVENOUS
  Filled 2014-07-13: qty 1

## 2014-07-13 MED ORDER — DEXTROSE 5 % IV SOLN
1.0000 g | Freq: Once | INTRAVENOUS | Status: AC
  Administered 2014-07-13: 1 g via INTRAVENOUS
  Filled 2014-07-13: qty 10

## 2014-07-13 MED ORDER — DOXYCYCLINE HYCLATE 100 MG PO CAPS: 100.0000 mg | ORAL_CAPSULE | Freq: Two times a day (BID) | ORAL | Status: DC

## 2014-07-13 MED ORDER — DEXAMETHASONE SODIUM PHOSPHATE 10 MG/ML IJ SOLN
10.0000 mg | Freq: Once | INTRAMUSCULAR | Status: AC
  Administered 2014-07-13: 10 mg via INTRAVENOUS
  Filled 2014-07-13: qty 1

## 2014-07-13 MED ORDER — SODIUM CHLORIDE 0.9 % IV BOLUS (SEPSIS): 1000.0000 mL | Freq: Once | INTRAVENOUS | Status: DC

## 2014-07-13 MED ORDER — AZITHROMYCIN 250 MG PO TABS: 250.0000 mg | ORAL_TABLET | Freq: Every day | ORAL | Status: DC

## 2014-07-13 MED ORDER — MAGNESIUM SULFATE IN D5W 10-5 MG/ML-% IV SOLN
1.0000 g | Freq: Once | INTRAVENOUS | Status: DC
Start: 2014-07-13 — End: 2014-07-13
  Filled 2014-07-13: qty 100

## 2014-07-13 MED ORDER — METOCLOPRAMIDE HCL 5 MG/ML IJ SOLN
10.0000 mg | Freq: Once | INTRAMUSCULAR | Status: AC
  Administered 2014-07-13: 10 mg via INTRAVENOUS
  Filled 2014-07-13: qty 2

## 2014-07-13 MED ORDER — OXYCODONE-ACETAMINOPHEN 5-325 MG PO TABS: 1.0000 | ORAL_TABLET | Freq: Four times a day (QID) | ORAL | Status: DC | PRN

## 2014-07-13 MED ORDER — DEXTROSE 5 % IV SOLN
500.0000 mg | INTRAVENOUS | Status: DC
  Administered 2014-07-13: 500 mg via INTRAVENOUS
  Filled 2014-07-13 (×2): qty 500

## 2014-07-13 MED ORDER — HYDROMORPHONE HCL PF 1 MG/ML IJ SOLN
1.0000 mg | Freq: Once | INTRAMUSCULAR | Status: AC
  Administered 2014-07-13: 1 mg via INTRAVENOUS
  Filled 2014-07-13: qty 1

## 2014-07-13 MED ORDER — SODIUM CHLORIDE 0.9 % IV BOLUS (SEPSIS)
1000.0000 mL | Freq: Once | INTRAVENOUS | Status: AC
  Administered 2014-07-13: 1000 mL via INTRAVENOUS

## 2014-07-13 NOTE — ED Provider Notes (Signed)
CSN: [id number]     Arrival date & time 07/13/14  1419 History   First MD Initiated Contact with Patient 07/13/14 1532     Chief Complaint  Patient presents with  . Headache    worst headache I ever had. Pain on l/side of head  . Rash    rash on hands and feet  . Fever    "temp of 103" 3 days ago     (Consider location/radiation/quality/duration/timing/severity/associated sxs/prior Treatment) Patient is a 33 y.o. male presenting with headaches, rash, and fever. The history is provided by the patient.  Headache Pain location:  L temporal Quality:  Sharp Radiates to:  Does not radiate Severity currently:  10/10 Severity at highest:  10/10 Onset quality:  Sudden Duration:  2 days Associated symptoms: cough, fever (began 2 days ago, hasn't had a fever in over 24 hours), myalgias (diffuse for past few days), sore throat and vomiting (once this morning)   Associated symptoms: no abdominal pain and no nausea   Rash Associated symptoms: fever (began 2 days ago, hasn't had a fever in over 24 hours), headaches, joint pain (L shoulder for past 2 hours), myalgias (diffuse for past few days), sore throat and vomiting (once this morning)   Associated symptoms: no abdominal pain, no nausea and no shortness of breath   Fever Associated symptoms: cough, headaches, myalgias (diffuse for past few days), rash (began 1.5 weeks ago, small papules on backs of hands. ), sore throat and vomiting (once this morning)   Associated symptoms: no chest pain, no chills and no nausea     History reviewed. No pertinent past medical history. Past Surgical History  Procedure Laterality Date  . Appendectomy     Family History  Problem Relation Age of Onset  . Cancer Mother   . Heart disease Father   . Heart attack Father    History  Substance Use Topics  . Smoking status: Current Every Day Smoker -- 0.50 packs/day    Types: Cigarettes  . Smokeless tobacco: Not on file  . Alcohol Use: Yes     Comment:  socially    Review of Systems  Constitutional: Positive for fever (began 2 days ago, hasn't had a fever in over 24 hours). Negative for chills.  HENT: Positive for sore throat. Negative for tinnitus and trouble swallowing.   Respiratory: Positive for cough. Negative for shortness of breath.   Cardiovascular: Negative for chest pain and leg swelling.  Gastrointestinal: Positive for vomiting (once this morning). Negative for nausea and abdominal pain.  Musculoskeletal: Positive for arthralgias (L shoulder for past 2 hours) and myalgias (diffuse for past few days).  Skin: Positive for rash (began 1.5 weeks ago, small papules on backs of hands. ).  Neurological: Positive for headaches.  All other systems reviewed and are negative.     Allergies  Hydrocodone  Home Medications   Prior to Admission medications   Medication Sig Start Date End Date Taking? Authorizing Provider  aspirin 325 MG tablet Take 325 mg by mouth once.   Yes Historical Provider, MD  aspirin-acetaminophen-caffeine (EXCEDRIN MIGRAINE) [id number] MG per tablet Take 1 tablet by mouth every 6 (six) hours as needed for headache.   Yes Historical Provider, MD  ibuprofen (ADVIL,MOTRIN) 200 MG tablet Take 600 mg by mouth every 6 (six) hours as needed for headache.   Yes Historical Provider, MD  oxyCODONE-acetaminophen (PERCOCET/ROXICET) 5-325 MG per tablet Take 1-2 tablets by mouth every 6 (six) hours as needed for severe pain (headache).  Yes Historical Provider, MD   BP 123/82  Pulse 99  Temp(Src) 98.8 F (37.1 C) (Oral)  Resp 18  Wt 235 lb (106.595 kg)  SpO2 100% Physical Exam  Nursing note and vitals reviewed. Constitutional: He is oriented to person, place, and time. He appears well-developed and well-nourished. No distress.  HENT:  Head: Normocephalic and atraumatic.  Mouth/Throat: Posterior oropharyngeal erythema (mild) present. No oropharyngeal exudate or posterior oropharyngeal edema.  Eyes: EOM are normal.  Pupils are equal, round, and reactive to light.  Neck: Normal range of motion. Neck supple. No JVD present. No tracheal deviation present.  Supple, no meningismus  Cardiovascular: Normal rate and regular rhythm.  Exam reveals no friction rub.   No murmur heard. Pulmonary/Chest: Effort normal and breath sounds normal. No stridor. No respiratory distress. He has no wheezes. He has no rales.  Abdominal: Soft. He exhibits no distension. There is no tenderness. There is no rebound.  Musculoskeletal: Normal range of motion. He exhibits no edema.  Neurological: He is alert and oriented to person, place, and time.  Skin: Rash (small papules on back of hands, no redness or cellulitis, no petechiae or purpura, no abscess, no blistering, no weeping. Excoriated on L wrist where he scracted. Same rash also present on L lower calf) noted. He is not diaphoretic.    ED Course  Procedures (including critical care time) Labs Review Labs Reviewed  CBC  COMPREHENSIVE METABOLIC PANEL    Imaging Review Dg Chest 2 View  07/13/2014   CLINICAL DATA:  33 year old male with fever.  EXAM: CHEST  2 VIEW  COMPARISON:  11/09/2011  FINDINGS: Right lower lobe airspace disease is compatible with pneumonia.  The cardiomediastinal silhouette is unremarkable.  There is no evidence of pulmonary edema, suspicious pulmonary nodule/mass, pleural effusion, or pneumothorax. No acute bony abnormalities are identified.  IMPRESSION: Right lower lobe pneumonia. Radiographic follow up to resolution is recommended.   Electronically Signed   By: Laveda Abbe M.D.   On: 07/13/2014 18:08   Ct Head Wo Contrast  07/13/2014   CLINICAL DATA:  Headaches  EXAM: CT HEAD WITHOUT CONTRAST  TECHNIQUE: Contiguous axial images were obtained from the base of the skull through the vertex without intravenous contrast.  COMPARISON:  None.  FINDINGS: The bony calvarium is intact. No findings to suggest acute hemorrhage, acute infarction or space-occupying mass  lesion are noted. No soft tissue changes are seen.  IMPRESSION: No acute abnormality is noted.   Electronically Signed   By: Alcide Clever M.D.   On: 07/13/2014 16:08     EKG Interpretation None      MDM   Final diagnoses:  Community acquired pneumonia    34 year old male presents with multiple complaints. Headache began 2 days ago after waking from a nap. General malaise earlier in the day and did not go to work. He woke up with a left-sided sharp and pounding headache. No relief with Motrin, Excedrin Migraine over the past 2 days. He has some light sensitivity with his headache also. States is the worst headache he's ever had, he has had headaches occasionally before never had one like this. Pain in the back of the head also. He also had a fever 103 2 days ago. Fever broke yesterday and hasn't had a fever sense. He did take oxycodone for relief the headache which provided mild relief for a brief amount of time. Is also had a rash on his hands and left lower leg for the past week and  a half. He had a similar rash when he was 12. He denies any plant exposures, camping, tick bites. Rash is small papules along the backs of the hands, wrists, left lower calf. No petechiae or purpura. No cellulitis. He's also complaining of sore throat, cough, myalgias, a left shoulder pain. He's 32, and at this young age I feel aneurysm is highly unlikely. He is not currently febrile. His neck is supple without any meningeal signs. His rash is not petechial rash consistent with meningococcemia. Denies any target lesions consistent with [facility] spotted rash. Think he likely has a viral syndrome, but with headache and fever a will give doxycycline to cover for possible [facility] spotted fever. Will scan his head, give migraine cocktail, check labs. CXR shows RLL PNA - likely cause of his symptoms. No relief of headache with migraine cocktail, will give narcotics. Rocephin, azithro given. Feeling better with  narcotics, headache improved. Patient given narcotics to go home, given Rx for azithro and for doxycycline to cover for possible RMSF.  Elwin Mocha, MD 07/13/14 [id number]

## 2014-07-13 NOTE — Progress Notes (Signed)
[facility] [employer],   Provided pt with a list of primary care resources and a [facility] Orange Card application to help patient establish primary care. Patient stated that he had not had a pcp in "20 years". CL encouraged patient to obtain a pcp.

## 2014-07-13 NOTE — Discharge Instructions (Signed)
Pneumonia °Pneumonia is an infection of the lungs.  °CAUSES °Pneumonia may be caused by bacteria or a virus. Usually, these infections are caused by breathing infectious particles into the lungs (respiratory tract). °SIGNS AND SYMPTOMS  °· Cough. °· Fever. °· Chest pain. °· Increased rate of breathing. °· Wheezing. °· Mucus production. °DIAGNOSIS  °If you have the common symptoms of pneumonia, your health care provider will typically confirm the diagnosis with a chest X-ray. The X-ray will show an abnormality in the lung (pulmonary infiltrate) if you have pneumonia. Other tests of your blood, urine, or sputum may be done to find the specific cause of your pneumonia. Your health care provider may also do tests (blood gases or pulse oximetry) to see how well your lungs are working. °TREATMENT  °Some forms of pneumonia may be spread to other people when you cough or sneeze. You may be asked to wear a mask before and during your exam. Pneumonia that is caused by bacteria is treated with antibiotic medicine. Pneumonia that is caused by the influenza virus may be treated with an antiviral medicine. Most other viral infections must run their course. These infections will not respond to antibiotics.  °HOME CARE INSTRUCTIONS  °· Cough suppressants may be used if you are losing too much rest. However, coughing protects you by clearing your lungs. You should avoid using cough suppressants if you can. °· Your health care provider may have prescribed medicine if he or she thinks your pneumonia is caused by bacteria or influenza. Finish your medicine even if you start to feel better. °· Your health care provider may also prescribe an expectorant. This loosens the mucus to be coughed up. °· Take medicines only as directed by your health care provider. °· Do not smoke. Smoking is a common cause of bronchitis and can contribute to pneumonia. If you are a smoker and continue to smoke, your cough may last several weeks after your  pneumonia has cleared. °· A cold steam vaporizer or humidifier in your room or home may help loosen mucus. °· Coughing is often worse at night. Sleeping in a semi-upright position in a recliner or using a couple pillows under your head will help with this. °· Get rest as you feel it is needed. Your body will usually let you know when you need to rest. °PREVENTION °A pneumococcal shot (vaccine) is available to prevent a common bacterial cause of pneumonia. This is usually suggested for: °· People over 65 years old. °· Patients on chemotherapy. °· People with chronic lung problems, such as bronchitis or emphysema. °· People with immune system problems. °If you are over 65 or have a high risk condition, you may receive the pneumococcal vaccine if you have not received it before. In some countries, a routine influenza vaccine is also recommended. This vaccine can help prevent some cases of pneumonia. You may be offered the influenza vaccine as part of your care. °If you smoke, it is time to quit. You may receive instructions on how to stop smoking. Your health care provider can provide medicines and counseling to help you quit. °SEEK MEDICAL CARE IF: °You have a fever. °SEEK IMMEDIATE MEDICAL CARE IF:  °· Your illness becomes worse. This is especially true if you are elderly or weakened from any other disease. °· You cannot control your cough with suppressants and are losing sleep. °· You begin coughing up blood. °· You develop pain which is getting worse or is uncontrolled with medicines. °· Any of the symptoms   which initially brought you in for treatment are getting worse rather than better. °· You develop shortness of breath or chest pain. °MAKE SURE YOU:  °· Understand these instructions. °· Will watch your condition. °· Will get help right away if you are not doing well or get worse. °Document Released: 10/19/2005 Document Revised: 03/05/2014 Document Reviewed: 01/08/2011 °ExitCare® Patient Information ©2015  [employer]. This information is not intended to replace advice given to you by your health care provider. Make sure you discuss any questions you have with your health care provider. ° ° °Emergency Department Resource Guide °1) Find a Doctor and Pay Out of Pocket °Although you won't have to find out who is covered by your insurance plan, it is a good idea to ask around and get recommendations. You will then need to call the office and see if the doctor you have chosen will accept you as a new patient and what types of options they offer for patients who are self-pay. Some doctors offer discounts or will set up payment plans for their patients who do not have insurance, but you will need to ask so you aren't surprised when you get to your appointment. ° °2) Contact Your Local Health Department °Not all health departments have doctors that can see patients for sick visits, but many do, so it is worth a call to see if yours does. If you don't know where your local health department is, you can check in your phone book. The CDC also has a tool to help you locate your state's health department, and many state websites also have listings of all of their local health departments. ° °3) Find a Walk-in Clinic °If your illness is not likely to be very severe or complicated, you may want to try a walk in clinic. These are popping up all over the country in pharmacies, drugstores, and shopping centers. They're usually staffed by nurse practitioners or physician assistants that have been trained to treat common illnesses and complaints. They're usually fairly quick and inexpensive. However, if you have serious medical issues or chronic medical problems, these are probably not your best option. ° °No Primary Care Doctor: °- Call [facility] at  832-8000 - they can help you locate a primary care doctor that  accepts your insurance, provides certain services, etc. °- Physician Referral Service- 1-800-533-3463 ° °Chronic Pain  Problems: °Organization         Address  Phone   Notes  °[facility]  (336) 297-2271 Patients need to be referred by their primary care doctor.  ° °Medication Assistance: °Organization         Address  Phone   Notes  °[place] [facility] [address] °[place], [place] [address] (336) 641-8030 --Must be a resident of [place] °-- Must have NO insurance coverage whatsoever (no Medicaid/ Medicare, etc.) °-- The pt. MUST have a primary care doctor that directs their care regularly and follows them in the community °  °[facility]  (866) 331-1348   °[employer]  (888) 892-1162   ° °Agencies that provide inexpensive medical care: °Organization         Address  Phone   Notes  °[facility]  (336) 832-8035   ° Internal Medicine    (336) 832-7272   °[facility] [address] °[place],  [address] (336) 832-4777   °[facility] [place] [facility] [address], °[place] (336) 271-4999   °  [facility]    (336) 373-0678   °[facility]    (336) 272-1050   °[facility] ° [address], [place] Phone:  (336) 832-4444, Fax:  (336) 832-4440 Hours of Operation:  9 am - 6 pm, M-F.  Also accepts Medicaid/Medicare and self-pay.  °[facility] ° [address], [place] Phone: (336) 832-3150, Fax: (336) 832-3151. Hours of Operation:  8:30 am - 5:30 pm, M-F.  Also accepts Medicaid and self-pay.  °[facility] [place] [address], [place] Phone: (336) 878-6027   °[facility] [address], [place], [place] (336)723-1848, Ext. 123 Mondays & Thursdays: 7-9 AM.  First 15 patients are seen on a first come, first serve basis. °  ° °Medicaid-accepting [place] Providers: ° °Organization         Address  Phone   Notes  °[facility] [address], [place] (336) 641-2100 Also  accepts self-pay patients.  °[facility] [address], [place] ° (336) 856-9996   °[facility] [address], [place] (336) 288-8857   °[facility] [address] [place] [address], [place] (336) 299-7000   °Veita Bland [address], [place]  ° (336) 373-1557 Only accepts [place] Access Medicaid patients after they have their name applied to their card.  ° °Self-Pay (no insurance) in [place]: ° °Organization         Address  Phone   Notes  °Sickle Cell Patients, [facility] [address], [place] (336) 832-1970   °[facility] [address], [place] (336) 832-4400   °[facility] [place] ° [address], [place] (336) 992-4800   °[facility]/Dr. Osei-Bonsu ° [address] [place] [address], [place] or [address], [place] (336) 841-8500 Phone number for both [place] and [place] locations is the same.  °[facility] [address], [place] (336) 299-0000   °[facility] [address] [place] [address], [place] or [address] (336) 852-7530 °(336) 878-2260   °[facility] [address], [place] (336) 350-1642, phone; (336) 294-5005, fax Sees patients 1st and 3rd Saturday of every month.  Must not qualify for public or private insurance (i.e. Medicaid, Medicare, [place] Health Choice, Veterans' Benefits) • Household income should be no more than 200% of the poverty level •The clinic cannot treat you if you are pregnant or think you are pregnant • Sexually transmitted diseases are not treated at the clinic.  ° ° °Dental Care: °Organization         Address  Phone  Notes  °[facility] [address], [place] (336) 641-6152 Accepts children up to age 21 who are enrolled in Medicaid or [place] Health Choice; pregnant  women with a Medicaid card; and children who have applied for Medicaid or [place] Health Choice, but were declined, whose parents can pay a reduced fee at time of service.  °[facility] [place]  [address], [place] (336) 641-7733 Accepts children up to age 21 who are enrolled in Medicaid or [place] Health Choice; pregnant women with a Medicaid card; and children who have applied for Medicaid or [place] Health Choice, but were declined,   whose parents can pay a reduced fee at time of service.  °[facility] ° [address], [place] (336) 641-4533 Patients are seen by appointment only. Walk-ins are not accepted. [facility] will see patients 18 years of age and older. °Monday - Tuesday (8am-5pm) °Most Wednesdays (8:30-5pm) °$30 per visit, cash only  °[facility] ° [address], [place] (336) 641-4533 Patients are seen by appointment only. Walk-ins are not accepted. [facility] will see patients 18 years of age and older. °One Wednesday Evening (Monthly: Volunteer Based).  $30 per visit, cash only  °[facility]  (919) 537-3737 for adults; Children under age 4, call Graduate Pediatric Dentistry at (919) [phone]. Children aged 4-14, please call (919) [phone] to request a pediatric application. ° Dental services are provided in all areas of dental care including fillings, crowns and bridges, complete and partial dentures, implants, gum treatment, root canals, and extractions. Preventive care is also provided. Treatment is provided to both adults and children. °Patients are selected via a lottery and there is often a waiting list. °  °[facility] [address], °[place] ° (336) 763-8833 [email or web address] °  °[facility] [address], [place], [place] (336)723-1848, Ext. 123 Second and Fourth Thursday of each month, opens at 6:30 AM; Clinic ends at 9 AM.  Patients are  seen on a first-come first-served basis, and a limited number are seen during each clinic.  ° °[facility] ° [address], [place], [place] (336) 723-7904   Eligibility Requirements °You must have lived in [place], [place], or [place] counties for at least the last three months. °  You cannot be eligible for state or federal sponsored healthcare insurance, including Veterans Administration, Medicaid, or Medicare. °  You generally cannot be eligible for healthcare insurance through your employer.  °  How to apply: °Eligibility screenings are held every Tuesday and Wednesday afternoon from 1:00 pm until 4:00 pm. You do not need an appointment for the interview!  °[facility] [address], [place], [place] 336-631-2330   °[facility]  336-342-8273   °[facility]  336-703-3100   °[facility]  336-570-6415   ° °Behavioral Health Resources in the Community: °Intensive Outpatient Programs °Organization         Address  Phone  Notes  °[facility] [address], [place], [place] 336-878-6098   °[facility] [address], [place], [place] 336-832-9800   °[facility] [address], [place], [place] ° 336-882-2125   °[facility] [address],  °[place], [place] 1-800-853-5163 or 336-641-4981   °Substance Abuse Resources °Organization         Address  Phone  Notes  °[facility]  336-882-2125   °[facility]  336-784-9470   °[facility]  336-285-9073   °[facility]  336-845-3988   °Residential & Outpatient Substance Abuse Program  1-800-659-3381   °Psychological Services °Organization         Address  Phone  Notes  °[facility]  336- 832-9600   °[facility]  336- 378-7881   °[facility] [address], [place] 1-800-853-5163 or 336-641-4981   ° °Mobile Crisis  Teams °Organization         Address  Phone  Notes  °[facility]   Crisis Care Unit  1-877-626-1772   °Assertive °[facility] ° [address] [place], [place] 336-834-9664   °Sharon DeEsch [address] °[place] 336-554-5454   ° °Self-Help/Support Groups °Organization         Address  Phone             Notes  °[facility] - variety of support groups  336- 373-1402 Call for more information  °Narcotics Anonymous (NA), [facility] [address], °[place]  2 meetings at this location  ° °Residential Treatment Programs °Organization         Address  Phone  Notes  °[facility] [address],    °[place]  1-866-801-8205   °[facility] ° [address], [place], [place] 704-293-8524   °[facility] [address], [place] 336-845-3988 Admissions: 8am-3pm M-F  °[facility] [address]    °[place], [place] 336-841-1104   °[facility] [address], [place], [place] 336-379-7146   °[facility] [address]  °[place], [place] 336-285-9073   °[facility] Intensive Outpatient [address], [place], [place] 336-852-3033   °[facility] [address]  °[place], [place] 1-877-615-2722 or 336-784-9470   °[facility] [address] [place], [place] 336-227-7417 Accepts Medicaid  °[facility] [address]  °[place] 1-800-659-3381 Substance Abuse/Addiction Treatment  ° °[place] Behavioral Health Resources °Organization         Address  Phone  Notes  °[facility]  (888) 581-9988   °Julie Brannon, PhD [address] [place], [place]   (336) 349-5553 or (336) 951-0000   °[facility]   [address] °[place], [place] (336) 349-4454   °[facility] [address], [place], [place] (336) 342-8316  Insurance/Medicaid/sponsorship through [facility]  °[facility] [address]                                    [place], [place] (336) 342-8316 Therapy/tele-psych/case  °[facility] [address]  ° [place],  (336) 349-2233    °Dr. Arfeen  (336) 349-4544   °[facility] [place]  [employer] [place] [facility] 1) [address], [place] °2) [address], [place] °3)  [address], [place] (336) 349-3220 °(336) 342-7768 ° °(336) 342-8140   °[place] [facility] (336) 342-1394 or (336) 342-3537 (After Hours)    ° ° ° °

## 2014-07-13 NOTE — ED Notes (Signed)
Pt reports severe headache. "the worst headache I ever had". Pt is alert, oriented and conversive. Drove self to ED.  Denies nausea at present. Vomited one this am. Headache unresponsive to OTC medications and one dosage of Vicodin yesterday C/o l/shoulder  "burning pain" over last hour C/o rash on both hand and and both feet x 1 week. Spread from fingers to hands and wrist

## 2017-04-25 ENCOUNTER — Emergency Department: Payer: Self-pay

## 2017-04-25 ENCOUNTER — Emergency Department
Admission: EM | Admit: 2017-04-25 | Discharge: 2017-04-25 | Disposition: A | Discharge status: Home / Self Care | Payer: Self-pay | Attending: Emergency Medicine | Admitting: Emergency Medicine

## 2017-04-25 DIAGNOSIS — R079 Chest pain, unspecified: Secondary | ICD-10-CM

## 2017-04-25 DIAGNOSIS — F17210 Nicotine dependence, cigarettes, uncomplicated: Secondary | ICD-10-CM | POA: Insufficient documentation

## 2017-04-25 DIAGNOSIS — R0789 Other chest pain: Secondary | ICD-10-CM | POA: Insufficient documentation

## 2017-04-25 DIAGNOSIS — Z79899 Other long term (current) drug therapy: Secondary | ICD-10-CM | POA: Insufficient documentation

## 2017-04-25 DIAGNOSIS — Z7982 Long term (current) use of aspirin: Secondary | ICD-10-CM | POA: Insufficient documentation

## 2017-04-25 LAB — CBC
HCT: 45.5 % (ref 40.0–52.0)
Hemoglobin: 16.2 g/dL (ref 13.0–18.0)
MCH: 33.1 pg (ref 26.0–34.0)
MCHC: 35.5 g/dL (ref 32.0–36.0)
MCV: 93.2 fL (ref 80.0–100.0)
Platelets: 262 10*3/uL (ref 150–440)
RBC: 4.88 MIL/uL (ref 4.40–5.90)
RDW: 13 % (ref 11.5–14.5)
WBC: 10.5 10*3/uL (ref 3.8–10.6)

## 2017-04-25 LAB — BASIC METABOLIC PANEL
Anion gap: 4 — ABNORMAL LOW (ref 5–15)
BUN: 13 mg/dL (ref 6–20)
CHLORIDE: 105 mmol/L (ref 101–111)
CO2: 26 mmol/L (ref 22–32)
Calcium: 9.5 mg/dL (ref 8.9–10.3)
Creatinine, Ser: 1.2 mg/dL (ref 0.61–1.24)
GFR calc Af Amer: >60 mL/min (ref >60)
GFR calc non Af Amer: >60 mL/min (ref >60)
Glucose, Bld: 106 mg/dL — ABNORMAL HIGH (ref 65–99)
POTASSIUM: 4 mmol/L (ref 3.5–5.1)
SODIUM: 135 mmol/L (ref 135–145)

## 2017-04-25 LAB — TROPONIN I
Troponin I: <0.03 ng/mL (ref <0.03)
Troponin I: <0.03 ng/mL (ref <0.03)

## 2017-04-25 NOTE — ED Notes (Signed)
Pt ambulated to room 20 with steady gait. labwork previously drawn in triage and resulted.

## 2017-04-25 NOTE — ED Provider Notes (Signed)
[facility] Emergency Department Provider Note  Time seen: 9:59 PM  I have reviewed the triage vital signs and the nursing notes.   HISTORY  Chief Complaint Chest Pain    HPI ELIAZAR OLIVAR is a 36 y.o. male with no past medical history who presents the emergency department for chest pain. According to the patient around 3:30 this afternoon he developed left-sided chest pain with radiation into his left arm. He states the pain was moderate, and he began feeling lightheaded like he might pass out. Denies passing out. Denies any shortness of breath nausea or diaphoresis. Patient states the pain lasted approximately 10 minutes and then resolved. No history of personal cardiac issues however he states his father and grandfather both had cardiac issues. Patient denies any leg pain or swelling. Denies any pain or discomfort at this time.  No past medical history on file.  There are no active problems to display for this patient.   Past Surgical History:  Procedure Laterality Date  . APPENDECTOMY      Prior to Admission medications   Medication Sig Start Date End Date Taking? Authorizing Provider  aspirin 325 MG tablet Take 325 mg by mouth once.    [provider]  aspirin-acetaminophen-caffeine (EXCEDRIN MIGRAINE) [phone] MG per tablet Take 1 tablet by mouth every 6 (six) hours as needed for headache.    [provider]  azithromycin (ZITHROMAX) 250 MG tablet Take 1 tablet (250 mg total) by mouth daily. Take 1 every day until finished. 07/13/14   Elwin Mocha, MD  doxycycline (VIBRAMYCIN) 100 MG capsule Take 1 capsule (100 mg total) by mouth 2 (two) times daily. 07/13/14   Elwin Mocha, MD  ibuprofen (ADVIL,MOTRIN) 200 MG tablet Take 600 mg by mouth every 6 (six) hours as needed for headache.    [provider]  oxyCODONE-acetaminophen (PERCOCET) 5-325 MG per tablet Take 1 tablet by mouth every 6 (six) hours as needed for moderate  pain. 07/13/14   Elwin Mocha, MD  oxyCODONE-acetaminophen (PERCOCET/ROXICET) 5-325 MG per tablet Take 1-2 tablets by mouth every 6 (six) hours as needed for severe pain (headache).    [provider]    Allergies  Allergen Reactions  . Hydrocodone     Nausea     Family History  Problem Relation Age of Onset  . Cancer Mother   . Heart disease Father   . Heart attack Father     Social History Social History  Substance Use Topics  . Smoking status: Current Every Day Smoker    Packs/day: 0.50    Types: Cigarettes  . Smokeless tobacco: Not on file  . Alcohol use Yes     Comment: socially    Review of Systems Constitutional: Negative for fever. Cardiovascular: Positive for chest pain now resolved. Respiratory: Negative for shortness of breath. Gastrointestinal: Negative for abdominal pain Musculoskeletal: Negative for back pain. Neurological: Negative for headache All other ROS negative  ____________________________________________   PHYSICAL EXAM:  VITAL SIGNS: ED Triage Vitals  Enc Vitals Group     BP 04/25/17 1615 138/83     Pulse Rate 04/25/17 1615 (!) 116     Resp 04/25/17 1615 18     Temp 04/25/17 1615 98.6 F (37 C)     Temp Source 04/25/17 1615 Oral     SpO2 04/25/17 1615 97 %     Weight 04/25/17 1615 246 lb (111.6 kg)     Height 04/25/17 1615 6\' 2"  (1.88 m)  Head Circumference --      Peak Flow --      Pain Score 04/25/17 1614 6     Pain Loc --      Pain Edu? --      Excl. in GC? --     Constitutional: Alert and oriented. Well appearing and in no distress. Eyes: Normal exam ENT   Head: Normocephalic and atraumatic.   Mouth/Throat: Mucous membranes are moist. Cardiovascular: Normal rate, regular rhythm. No murmur Respiratory: Normal respiratory effort without tachypnea nor retractions. Breath sounds are clear  Gastrointestinal: Soft and nontender. No distention.   Musculoskeletal: Nontender with normal range of motion in all  extremities. No lower extremity tenderness or edema. Neurologic:  Normal speech and language. No gross focal neurologic deficits  Skin:  Skin is warm, dry and intact.  Psychiatric: Mood and affect are normal.   ____________________________________________    EKG  EKG reviewed and interpreted by myself shows sinus tachycardia 113 bpm, narrow QRS, normal axis, normal intervals, nonspecific but no concerning ST changes.  ____________________________________________    RADIOLOGY  X-ray negative  ____________________________________________   INITIAL IMPRESSION / ASSESSMENT AND PLAN / ED COURSE  Pertinent labs & imaging results that were available during my care of the patient were reviewed by me and considered in my medical decision making (see chart for details).  Patient present to the emergency department for chest pain lasting approximately 10 minutes, is completely resolved at this time. Patient's initial workup with EKG, chest x-ray and labs is largely normal. Patient appears well with a normal physical exam. We will repeat a troponin at the patient's second troponin is negative I believe the patient could be safely discharged home with PCP follow-up. Patient states he has been expressing intermittent chest pain for the past 15 years, we will refer to cardiology for a stress test. Patient agreeable.  Repeat troponin negative. Patient will be discharged home.    ____________________________________________   FINAL CLINICAL IMPRESSION(S) / ED DIAGNOSES  Chest pain    Minna AntisPaduchowski, Marcio Hoque, MD 04/25/17 2223

## 2017-04-25 NOTE — Discharge Instructions (Signed)
You have been seen in the emergency department today for chest pain. Your workup has shown normal results. As we discussed please follow-up with your primary care physician in the next 1-2 days for recheck. Return to the emergency department for any further chest pain, trouble breathing, or any other symptom personally concerning to yourself. °

## 2017-04-25 NOTE — ED Triage Notes (Signed)
Pt states that while watching a movie he started having left sided chest pain that radiates to his left arm, pt reports feeling dizzy, denies nausea.

## 2017-06-03 ENCOUNTER — Encounter (HOSPITAL_COMMUNITY): Payer: Self-pay

## 2017-06-03 ENCOUNTER — Emergency Department (HOSPITAL_COMMUNITY): Payer: Self-pay

## 2017-06-03 ENCOUNTER — Emergency Department (HOSPITAL_COMMUNITY)
Admission: EM | Admit: 2017-06-03 | Discharge: 2017-06-03 | Disposition: A | Discharge status: Home / Self Care | Payer: Self-pay | Attending: Emergency Medicine | Admitting: Emergency Medicine

## 2017-06-03 DIAGNOSIS — H538 Other visual disturbances: Secondary | ICD-10-CM | POA: Insufficient documentation

## 2017-06-03 DIAGNOSIS — R519 Headache, unspecified: Secondary | ICD-10-CM

## 2017-06-03 DIAGNOSIS — M791 Myalgia: Secondary | ICD-10-CM | POA: Insufficient documentation

## 2017-06-03 DIAGNOSIS — R51 Headache: Secondary | ICD-10-CM | POA: Insufficient documentation

## 2017-06-03 DIAGNOSIS — F17210 Nicotine dependence, cigarettes, uncomplicated: Secondary | ICD-10-CM | POA: Insufficient documentation

## 2017-06-03 LAB — CBC WITH DIFFERENTIAL/PLATELET
BASOS PCT: 0 %
Basophils Absolute: 0 10*3/uL (ref 0.0–0.1)
EOS ABS: 0.1 10*3/uL (ref 0.0–0.7)
Eosinophils Relative: 1 %
HCT: 43.7 % (ref 39.0–52.0)
Hemoglobin: 15.2 g/dL (ref 13.0–17.0)
Lymphocytes Relative: 12 %
Lymphs Abs: 1.5 10*3/uL (ref 0.7–4.0)
MCH: 32.5 pg (ref 26.0–34.0)
MCHC: 34.8 g/dL (ref 30.0–36.0)
MCV: 93.6 fL (ref 78.0–100.0)
MONO ABS: 1.7 10*3/uL — AB (ref 0.1–1.0)
Monocytes Relative: 14 %
Neutro Abs: 8.8 10*3/uL — ABNORMAL HIGH (ref 1.7–7.7)
Neutrophils Relative %: 73 %
Platelets: 253 10*3/uL (ref 150–400)
RBC: 4.67 MIL/uL (ref 4.22–5.81)
RDW: 12.9 % (ref 11.5–15.5)
WBC: 12.1 10*3/uL — ABNORMAL HIGH (ref 4.0–10.5)

## 2017-06-03 LAB — BASIC METABOLIC PANEL
Anion gap: 8 (ref 5–15)
BUN: 5 mg/dL — ABNORMAL LOW (ref 6–20)
CALCIUM: 9.3 mg/dL (ref 8.9–10.3)
CO2: 23 mmol/L (ref 22–32)
CREATININE: 0.89 mg/dL (ref 0.61–1.24)
Chloride: 104 mmol/L (ref 101–111)
GFR calc Af Amer: >60 mL/min (ref >60)
GFR calc non Af Amer: >60 mL/min (ref >60)
GLUCOSE: 110 mg/dL — AB (ref 65–99)
Potassium: 3.8 mmol/L (ref 3.5–5.1)
Sodium: 135 mmol/L (ref 135–145)

## 2017-06-03 MED ORDER — DIPHENHYDRAMINE HCL 50 MG/ML IJ SOLN
12.5000 mg | Freq: Once | INTRAMUSCULAR | Status: AC
  Administered 2017-06-03: 12.5 mg via INTRAVENOUS
  Filled 2017-06-03: qty 1

## 2017-06-03 MED ORDER — METOCLOPRAMIDE HCL 5 MG/ML IJ SOLN
10.0000 mg | Freq: Once | INTRAMUSCULAR | Status: AC
  Administered 2017-06-03: 10 mg via INTRAVENOUS
  Filled 2017-06-03: qty 2

## 2017-06-03 MED ORDER — SODIUM CHLORIDE 0.9 % IV BOLUS (SEPSIS)
1000.0000 mL | Freq: Once | INTRAVENOUS | Status: AC
  Administered 2017-06-03: 1000 mL via INTRAVENOUS

## 2017-06-03 MED ORDER — BUTALBITAL-APAP-CAFFEINE 50-325-40 MG PO TABS: 1.0000 | ORAL_TABLET | Freq: Four times a day (QID) | ORAL | 0 refills | Status: DC | PRN

## 2017-06-03 MED ORDER — KETOROLAC TROMETHAMINE 30 MG/ML IJ SOLN
30.0000 mg | Freq: Once | INTRAMUSCULAR | Status: AC
  Administered 2017-06-03: 30 mg via INTRAVENOUS
  Filled 2017-06-03: qty 1

## 2017-06-03 NOTE — ED Notes (Signed)
Patient is gone to xray 

## 2017-06-03 NOTE — ED Triage Notes (Signed)
Pt presents for evaluation of generalized body aches and headache x 6 days. Pt denies URI symptoms, denies N/V/D. Pt reports persistent sweating, denies fever/chills.

## 2017-06-03 NOTE — Discharge Instructions (Signed)
Please read attached information regarding her condition. Take Fioricet every 6 hours for headache. Return to ED for worsening pain, vision changes, numbness, weakness, trouble walking, head injury.

## 2017-06-03 NOTE — ED Provider Notes (Signed)
[facility]-EMERGENCY DEPT Provider Note   CSN: [id number] Arrival date & time: 06/03/17  1023     History   Chief Complaint Chief Complaint  Patient presents with  . Headache  . Generalized Body Aches    HPI Jared Humphrey is a 36 y.o. male.  HPI Patient presents to ED for evaluation of headache for the past 5 days as well as generalized bodyaches and intermittent blurry vision. He states that despite the use of ibuprofen, aspirin and Aleve he continues to have headache. Reports previous similar headaches in the past. He reports left-sided back/rib pain as well. Also reports some intermittent diarrhea he describes that is "just an upset stomach" that has resolved on its own a few days ago, as well as occasional night sweats. He denies any sick contacts, head injury, falls, loss of consciousness, fevers, neck pain, nausea, vomiting, trouble breathing, trouble swallowing, chest pain, weakness, numbness, recent surgery, recent travel, injury or accident. Patient states that he [facility] but states this does not feel similar to that. He denies any tick exposure or rashes or recent history of prolonged times been outside.  History reviewed. No pertinent past medical history.  There are no active problems to display for this patient.   Past Surgical History:  Procedure Laterality Date  . APPENDECTOMY         Home Medications    Prior to Admission medications   Medication Sig Start Date End Date Taking? Authorizing Provider  butalbital-acetaminophen-caffeine (FIORICET, ESGIC) [phone] MG tablet Take 1-2 tablets by mouth every 6 (six) hours as needed for headache. 06/03/17 06/03/18  Dietrich PatesKhatri, Cable Fearn, PA-C    Family History Family History  Problem Relation Age of Onset  . Heart disease Father   . Heart attack Father   . Cancer Mother     Social History Social History  Substance Use Topics  . Smoking status: Current Every Day Smoker   Packs/day: 0.50    Types: Cigarettes  . Smokeless tobacco: Not on file  . Alcohol use Yes     Comment: socially     Allergies   Hydrocodone   Review of Systems Review of Systems  Constitutional: Positive for chills and fatigue. Negative for appetite change and fever.  HENT: Negative for ear pain, rhinorrhea, sneezing and sore throat.   Eyes: Positive for visual disturbance. Negative for photophobia.  Respiratory: Negative for cough, chest tightness, shortness of breath and wheezing.   Cardiovascular: Negative for chest pain and palpitations.  Gastrointestinal: Positive for diarrhea. Negative for abdominal pain, blood in stool, constipation, nausea and vomiting.  Genitourinary: Negative for dysuria, hematuria and urgency.  Musculoskeletal: Positive for back pain and myalgias.  Skin: Negative for color change and rash.  Neurological: Positive for headaches. Negative for dizziness, syncope, speech difficulty, weakness, light-headedness and numbness.     Physical Exam Updated Vital Signs BP (!) 147/76 (BP Location: Right Arm)   Pulse 94   Temp 98 F (36.7 C) (Oral)   Resp (!) 22   Ht 6\' 2"  (1.88 m)   Wt 123.4 kg (272 lb)   SpO2 100%   BMI 34.92 kg/m   Physical Exam  Constitutional: He is oriented to person, place, and time. He appears well-developed and well-nourished. No distress.  HENT:  Head: Normocephalic and atraumatic.  Nose: Nose normal.  Eyes: Conjunctivae and EOM are normal. Left eye exhibits no discharge. No scleral icterus.  Neck: Normal range of motion. Neck supple.  Cardiovascular:  Normal rate, regular rhythm, normal heart sounds and intact distal pulses.  Exam reveals no gallop and no friction rub.   No murmur heard. Pulmonary/Chest: Effort normal and breath sounds normal. No respiratory distress.  Abdominal: Soft. Bowel sounds are normal. He exhibits no distension. There is no tenderness. There is no guarding.  Musculoskeletal: Normal range of motion. He  exhibits tenderness. He exhibits no edema.       Arms: Tenderness to palpation in the indicated area. No midline spinal tenderness noted. Full active and passive range of motion of the neck.  Neurological: He is alert and oriented to person, place, and time. No cranial nerve deficit or sensory deficit. He exhibits normal muscle tone. Coordination normal.  Pupils reactive. No facial asymmetry noted. Cranial nerves appear grossly intact. Sensation intact to light touch on face, BUE and BLE. Strength 5/5 in BUE and BLE. Normal patellar reflexes bilaterally.  Skin: Skin is warm and dry. No rash noted.  Psychiatric: He has a normal mood and affect.  Nursing note and vitals reviewed.    ED Treatments / Results  Labs (all labs ordered are listed, but only abnormal results are displayed) Labs Reviewed  BASIC METABOLIC PANEL - Abnormal; Notable for the following:       Result Value   Glucose, Bld 110 (*)    BUN 5 (*)    All other components within normal limits  CBC WITH DIFFERENTIAL/PLATELET - Abnormal; Notable for the following:    WBC 12.1 (*)    Neutro Abs 8.8 (*)    Monocytes Absolute 1.7 (*)    All other components within normal limits    EKG  EKG Interpretation None       Radiology Dg Chest 2 View  Result Date: 06/03/2017 CLINICAL DATA:  Left back and rib pain. EXAM: CHEST  2 VIEW COMPARISON:  Chest x-ray dated April 25, 2017. FINDINGS: The cardiomediastinal silhouette is normal in size. Normal pulmonary vascularity. Increased bibasilar streaky densities, likely representing atelectasis. No focal consolidation, pleural effusion, or pneumothorax. No acute osseous abnormality. IMPRESSION: No active cardiopulmonary disease. Electronically Signed   By: Obie DredgeWilliam T Derry M.D.   On: 06/03/2017 13:19   Ct Head Wo Contrast  Result Date: 06/03/2017 CLINICAL DATA:  Headache, dizziness. EXAM: CT HEAD WITHOUT CONTRAST TECHNIQUE: Contiguous axial images were obtained from the base of the skull  through the vertex without intravenous contrast. COMPARISON:  CT scan of July 13, 2014. FINDINGS: Brain: No evidence of acute infarction, hemorrhage, hydrocephalus, extra-axial collection or mass lesion/mass effect. Vascular: No hyperdense vessel or unexpected calcification. Skull: Normal. Negative for fracture or focal lesion. Sinuses/Orbits: No acute finding. Other: None. IMPRESSION: Normal head CT. Electronically Signed   By: Lupita RaiderJames  Green Jr, M.D.   On: 06/03/2017 14:11    Procedures Procedures (including critical care time)  Medications Ordered in ED Medications  ketorolac (TORADOL) 30 MG/ML injection 30 mg (30 mg Intravenous Given 06/03/17 1230)  sodium chloride 0.9 % bolus 1,000 mL (1,000 mLs Intravenous New Bag/Given 06/03/17 1235)  diphenhydrAMINE (BENADRYL) injection 12.5 mg (12.5 mg Intravenous Given 06/03/17 1230)  metoCLOPramide (REGLAN) injection 10 mg (10 mg Intravenous Given 06/03/17 1230)     Initial Impression / Assessment and Plan / ED Course  I have reviewed the triage vital signs and the nursing notes.  Pertinent labs & imaging results that were available during my care of the patient were reviewed by me and considered in my medical decision making (see chart for details).  Patient presents to ED for evaluation of headache and generalized body aches for the past 5 days, intermittent blurry vision. He also reports intermittent night sweats, as well as left posterior rib pain. He denies any nausea, vomiting, fevers, headache injury, loss of consciousness, numbness, weakness or trouble walking. He denies any URI symptoms. He has a history of [place]. spotted fever but states that this does not feel similar to that. On physical exam there are no focal findings on neurological exam. He is able to ambulate here in the ED without difficulty. He is afebrile with no history of fever. He denies any sick contacts with similar symptoms. Low suspicion for acute intra-cranial abnormality  or bleed being the cause of his headache, but will obtain CT scan due to intermittent blurry vision that the patient states he is experiencing. There is tenderness to palpation on the back at the indicated area. Chest x-ray and head CT normal. CBC, BMP unremarkable. Patient reports some improvement in symptoms with migraine cocktail given here in the ED. We'll discharge with Fioricet and advised to follow-up with PCP if symptoms persist. Patient appears stable for discharge at this time. Strict return precautions given.  Final Clinical Impressions(s) / ED Diagnoses   Final diagnoses:  Nonintractable headache, unspecified chronicity pattern, unspecified headache type    New Prescriptions New Prescriptions   BUTALBITAL-ACETAMINOPHEN-CAFFEINE (FIORICET, ESGIC) 50-325-40 MG TABLET    Take 1-2 tablets by mouth every 6 (six) hours as needed for headache.     Dietrich Pates, PA-C 06/03/17 1428    Cathren Laine, MD 06/03/17 1431

## 2017-06-03 NOTE — ED Notes (Addendum)
Headache and generalized body aches since Saturday along with intermittent night sweats.  Pt denies any fever or N/V/D.

## 2017-11-05 ENCOUNTER — Emergency Department (HOSPITAL_COMMUNITY)
Admission: EM | Admit: 2017-11-05 | Discharge: 2017-11-06 | Disposition: A | Discharge status: Home / Self Care | Payer: Self-pay | Attending: Emergency Medicine | Admitting: Emergency Medicine

## 2017-11-05 ENCOUNTER — Other Ambulatory Visit: Payer: Self-pay

## 2017-11-05 ENCOUNTER — Encounter (HOSPITAL_COMMUNITY): Payer: Self-pay | Admitting: Emergency Medicine

## 2017-11-05 DIAGNOSIS — R519 Headache, unspecified: Secondary | ICD-10-CM

## 2017-11-05 DIAGNOSIS — F17210 Nicotine dependence, cigarettes, uncomplicated: Secondary | ICD-10-CM | POA: Insufficient documentation

## 2017-11-05 DIAGNOSIS — R51 Headache: Secondary | ICD-10-CM | POA: Insufficient documentation

## 2017-11-05 NOTE — ED Triage Notes (Addendum)
Pt has had a headache X14 days w/ the past three days "being really intense."  Pt denies n/v/d/fevers/flu like symptoms/respiratory issues.  The headache has been so bad he has been unable to sleep X3 days.  No neuro deficienties.  Pt stated at the end that at times his chest "has palpitations", however he is not experiencing them now, pt denies n/v/dizziness/diaphories during these "episodes."

## 2017-11-06 MED ORDER — KETOROLAC TROMETHAMINE 30 MG/ML IJ SOLN
30.0000 mg | Freq: Once | INTRAMUSCULAR | Status: AC
  Administered 2017-11-06: 30 mg via INTRAVENOUS
  Filled 2017-11-06: qty 1

## 2017-11-06 MED ORDER — BUTALBITAL-APAP-CAFFEINE 50-325-40 MG PO TABS: 1.0000 | ORAL_TABLET | Freq: Four times a day (QID) | ORAL | 0 refills | Status: DC | PRN

## 2017-11-06 MED ORDER — PROCHLORPERAZINE EDISYLATE 5 MG/ML IJ SOLN
10.0000 mg | Freq: Once | INTRAMUSCULAR | Status: AC
  Administered 2017-11-06: 10 mg via INTRAVENOUS
  Filled 2017-11-06: qty 2

## 2017-11-06 MED ORDER — SODIUM CHLORIDE 0.9 % IV BOLUS (SEPSIS)
1000.0000 mL | Freq: Once | INTRAVENOUS | Status: AC
  Administered 2017-11-06: 1000 mL via INTRAVENOUS

## 2017-11-06 NOTE — ED Provider Notes (Signed)
[facility] Provider Note   CSN: [id number] Arrival date & time: 11/05/17  1900     History   Chief Complaint Chief Complaint  Patient presents with  . Headache    HPI Clarita LeberWilliam F Humphrey is a 37 y.o. male.  The history is provided by the patient and medical records.  Headache     37 y.o. M here with headache x 2 weeks.  Patient states he has a longstanding history of headaches but over the past 2 weeks headache just "will not let up".  Patient states headache localized to left side of his head, described as pulsatile pain.  Pain does not move around or radiate anywhere else.  No dizziness, blurred vision, confusion, nausea, vomiting, neck pain, fever, chills, sweats.  No head trauma.  Not on anticoagulation.  States he has been taking tylenol, motrin, aleve, aspirin, and excedrin without relief.  Patient has no formal diagnosis of migraine/cluster headaches, has never seen a specialist for this type of thing in the past.  History reviewed. No pertinent past medical history.  There are no active problems to display for this patient.   Past Surgical History:  Procedure Laterality Date  . APPENDECTOMY         Home Medications    Prior to Admission medications   Medication Sig Start Date End Date Taking? Authorizing Provider  butalbital-acetaminophen-caffeine (FIORICET, ESGIC) [id number] MG tablet Take 1-2 tablets by mouth every 6 (six) hours as needed for headache. 06/03/17 06/03/18  Dietrich PatesKhatri, H[place], PA-C    Family History Family History  Problem Relation Age of Onset  . Heart disease Father   . Heart attack Father   . Cancer Mother     Social History Social History   Tobacco Use  . Smoking status: Current Every Day Smoker    Packs/day: 0.50    Types: Cigarettes  Substance Use Topics  . Alcohol use: Yes    Comment: socially  . Drug use: No     Allergies   Hydrocodone   Review of Systems Review of Systems    Neurological: Positive for headaches.     Physical Exam Updated Vital Signs BP 122/66 (BP Location: Right Arm)   Pulse 80   Temp 97.9 F (36.6 C) (Oral)   Resp 16   Ht 6\' 2"  (1.88 m)   Wt 124.7 kg (275 lb)   SpO2 98%   BMI 35.31 kg/m   Physical Exam  Constitutional: He is oriented to person, place, and time. He appears well-developed and well-nourished. No distress.  HENT:  Head: Normocephalic and atraumatic.  Right Ear: External ear normal.  Left Ear: External ear normal.  Atraumatic, no rash or other lesions noted along left scalp where he indicates area of pain  Eyes: Conjunctivae and EOM are normal. Pupils are equal, round, and reactive to light.  Neck: Normal range of motion and full passive range of motion without pain. Neck supple. No neck rigidity.  No rigidity, no meningismus  Cardiovascular: Normal rate, regular rhythm and normal heart sounds.  No murmur heard. Pulmonary/Chest: Effort normal and breath sounds normal. No respiratory distress. He has no wheezes. He has no rhonchi.  Abdominal: Soft. Bowel sounds are normal. There is no tenderness. There is no guarding.  Musculoskeletal: Normal range of motion. He exhibits no edema.  Neurological: He is alert and oriented to person, place, and time. He has normal strength. He displays no tremor. No cranial nerve deficit or sensory deficit. He  displays no seizure activity.  AAOx3, answering questions and following commands appropriately; equal strength UE and LE bilaterally; CN grossly intact; moves all extremities appropriately without ataxia; no focal neuro deficits or facial asymmetry appreciated  Skin: Skin is warm and dry. No rash noted. He is not diaphoretic.  Psychiatric: He has a normal mood and affect. His behavior is normal. Thought content normal.  Nursing note and vitals reviewed.    ED Treatments / Results  Labs (all labs ordered are listed, but only abnormal results are displayed) Labs Reviewed - No  data to display  EKG  EKG Interpretation None       Radiology No results found.  Procedures Procedures (including critical care time)  Medications Ordered in ED Medications  sodium chloride 0.9 % bolus 1,000 mL (0 mLs Intravenous Stopped 11/06/17 0232)  ketorolac (TORADOL) 30 MG/ML injection 30 mg (30 mg Intravenous Given 11/06/17 0057)  prochlorperazine (COMPAZINE) injection 10 mg (10 mg Intravenous Given 11/06/17 0057)     Initial Impression / Assessment and Plan / ED Course  I have reviewed the triage vital signs and the nursing notes.  Pertinent labs & imaging results that were available during my care of the patient were reviewed by me and considered in my medical decision making (see chart for details).  37 year old male here with headache for the past 2 weeks.  Report left-sided in nature.  No associated symptoms.  He is afebrile and nontoxic.  Neurologic exam is nonfocal.  He has no signs or symptoms concerning for meningitis.  He has a very small isolated area of pain to the left parietal scalp, I do not appreciate any rash or other lesions along this area.  Will give migraine cocktail and reassess.  Patient has had prior head CT's for headaches including one in June 2018 that was normal.  3:00 AM Patient reassessed.  States his headache is not any better.  States now he is just tired and really wants to go home.  Remains neurologically intact.  Can give trial of Fioricet at home, however if he continues having ongoing headaches like this he will need some follow-up with neurology.  Will give information for local neurology clinics.  Discussed plan with patient, he acknowledged understanding and agreed with plan of care.  Return precautions given for new or worsening symptoms.  Final Clinical Impressions(s) / ED Diagnoses   Final diagnoses:  Bad headache    ED Discharge Orders        Ordered    butalbital-acetaminophen-caffeine (FIORICET, ESGIC) 50-325-40 MG tablet  Every  6 hours PRN     11/06/17 0301       Garlon Hatchet, PA-C 11/06/17 0451    Gilda Crease, MD 11/06/17 [phone]

## 2017-11-06 NOTE — Discharge Instructions (Signed)
Take the prescribed medication as directed. Follow-up with neurology if you have ongoing issues-- can call for appt. Return to the ED for new or worsening symptoms.

## 2017-11-06 NOTE — ED Notes (Signed)
Pt reports a headache that he has had for the past 14 days. Pt reports taking OTC meds with no relief.

## 2017-11-21 ENCOUNTER — Emergency Department (HOSPITAL_COMMUNITY)
Admission: EM | Admit: 2017-11-21 | Discharge: 2017-11-21 | Disposition: A | Discharge status: Home / Self Care | Payer: Self-pay | Attending: Emergency Medicine | Admitting: Emergency Medicine

## 2017-11-21 ENCOUNTER — Encounter (HOSPITAL_COMMUNITY): Payer: Self-pay | Admitting: Emergency Medicine

## 2017-11-21 DIAGNOSIS — Z79899 Other long term (current) drug therapy: Secondary | ICD-10-CM | POA: Insufficient documentation

## 2017-11-21 DIAGNOSIS — F17210 Nicotine dependence, cigarettes, uncomplicated: Secondary | ICD-10-CM | POA: Insufficient documentation

## 2017-11-21 DIAGNOSIS — R519 Headache, unspecified: Secondary | ICD-10-CM

## 2017-11-21 DIAGNOSIS — R51 Headache: Secondary | ICD-10-CM | POA: Insufficient documentation

## 2017-11-21 LAB — CBC WITH DIFFERENTIAL/PLATELET
BASOS ABS: 0 10*3/uL (ref 0.0–0.1)
Basophils Relative: 0 %
EOS PCT: 1 %
Eosinophils Absolute: 0.1 10*3/uL (ref 0.0–0.7)
HEMATOCRIT: 42.6 % (ref 39.0–52.0)
Hemoglobin: 14.5 g/dL (ref 13.0–17.0)
LYMPHS ABS: 2.5 10*3/uL (ref 0.7–4.0)
LYMPHS PCT: 28 %
MCH: 32.3 pg (ref 26.0–34.0)
MCHC: 34 g/dL (ref 30.0–36.0)
MCV: 94.9 fL (ref 78.0–100.0)
Monocytes Absolute: 0.6 10*3/uL (ref 0.1–1.0)
Monocytes Relative: 6 %
Neutro Abs: 5.7 10*3/uL (ref 1.7–7.7)
Neutrophils Relative %: 65 %
PLATELETS: 264 10*3/uL (ref 150–400)
RBC: 4.49 MIL/uL (ref 4.22–5.81)
RDW: 12.8 % (ref 11.5–15.5)
WBC: 8.9 10*3/uL (ref 4.0–10.5)

## 2017-11-21 LAB — I-STAT CHEM 8, ED
BUN: 8 mg/dL (ref 6–20)
CALCIUM ION: 1.21 mmol/L (ref 1.15–1.40)
Chloride: 105 mmol/L (ref 101–111)
Creatinine, Ser: 0.7 mg/dL (ref 0.61–1.24)
GLUCOSE: 94 mg/dL (ref 65–99)
HCT: 44 % (ref 39.0–52.0)
Hemoglobin: 15 g/dL (ref 13.0–17.0)
POTASSIUM: 3.8 mmol/L (ref 3.5–5.1)
Sodium: 141 mmol/L (ref 135–145)
TCO2: 23 mmol/L (ref 22–32)

## 2017-11-21 MED ORDER — BUTALBITAL-APAP-CAFFEINE 50-325-40 MG PO TABS: 1.0000 | ORAL_TABLET | Freq: Four times a day (QID) | ORAL | 0 refills | Status: AC | PRN

## 2017-11-21 MED ORDER — KETOROLAC TROMETHAMINE 30 MG/ML IJ SOLN
30.0000 mg | Freq: Once | INTRAMUSCULAR | Status: AC
  Administered 2017-11-21: 30 mg via INTRAVENOUS
  Filled 2017-11-21: qty 1

## 2017-11-21 MED ORDER — MAGNESIUM SULFATE IN D5W 1-5 GM/100ML-% IV SOLN
1.0000 g | Freq: Once | INTRAVENOUS | Status: AC
  Administered 2017-11-21: 1 g via INTRAVENOUS
  Filled 2017-11-21: qty 100

## 2017-11-21 MED ORDER — SODIUM CHLORIDE 0.9 % IV BOLUS (SEPSIS)
1000.0000 mL | Freq: Once | INTRAVENOUS | Status: AC
Start: 2017-11-21 — End: 2017-11-21
  Administered 2017-11-21: 1000 mL via INTRAVENOUS

## 2017-11-21 MED ORDER — METOCLOPRAMIDE HCL 5 MG/ML IJ SOLN
10.0000 mg | Freq: Once | INTRAMUSCULAR | Status: AC
  Administered 2017-11-21: 10 mg via INTRAVENOUS
  Filled 2017-11-21: qty 2

## 2017-11-21 MED ORDER — DIPHENHYDRAMINE HCL 50 MG/ML IJ SOLN
12.5000 mg | Freq: Once | INTRAMUSCULAR | Status: AC
  Administered 2017-11-21: 12.5 mg via INTRAVENOUS
  Filled 2017-11-21: qty 1

## 2017-11-21 MED ORDER — DEXAMETHASONE SODIUM PHOSPHATE 10 MG/ML IJ SOLN
10.0000 mg | Freq: Once | INTRAMUSCULAR | Status: AC
  Administered 2017-11-21: 10 mg via INTRAVENOUS
  Filled 2017-11-21: qty 1

## 2017-11-21 NOTE — ED Provider Notes (Signed)
[facility]-EMERGENCY DEPT Provider Note   CSN: [id number] Arrival date & time: 11/21/17  1226     History   Chief Complaint Chief Complaint  Patient presents with  . Headache    HPI Jared Humphrey is a 37 y.o. male.  HPI   Patient is a 37 year old male with no significant past medical history presenting for left temporal headache for 34 days.  Patient reports he has been evaluated by emergency medicine multiple times for this headache, and it is consistent with prior headaches.  Patient reports that the difference in this particular episode is that it is persistent and preventing him from sleeping.  Patient reports he will wake up in the middle the night with "knifelike" pain.  Over this interval, patient denies any fever, chills, neck stiffness, neck pain, rashes, blurred vision, double vision, paresthesias, weakness.  Patient reports that he takes daily medications to improve his symptoms.  These include accommodation of Fioricet, ASA with caffeine, acetaminophen, and ibuprofen.  That he takes each at 6-hour intervals, however does combine them.  Patient reports 3 cups of caffeine daily.  Patient denies cocaine use, methamphetamine use, heroin use.  Patient does report occasional marijuana use.  History reviewed. No pertinent past medical history.  There are no active problems to display for this patient.   Past Surgical History:  Procedure Laterality Date  . APPENDECTOMY         Home Medications    Prior to Admission medications   Medication Sig Start Date End Date Taking? Authorizing Provider  butalbital-acetaminophen-caffeine (FIORICET, ESGIC) [phone] MG tablet Take 1 tablet by mouth every 6 (six) hours as needed for headache. 11/06/17 11/06/18  Garlon HatchetSanders, Lisa M, PA-C    Family History Family History  Problem Relation Age of Onset  . Heart disease Father   . Heart attack Father   . Cancer Mother     Social History Social History    Tobacco Use  . Smoking status: Current Every Day Smoker    Packs/day: 0.50    Types: Cigarettes  Substance Use Topics  . Alcohol use: Yes    Comment: socially  . Drug use: No     Allergies   Hydrocodone   Review of Systems Review of Systems  Constitutional: Negative for chills and fever.  HENT: Negative for congestion.   Eyes: Negative for photophobia and visual disturbance.  Gastrointestinal: Negative for nausea and vomiting.  Musculoskeletal: Negative for gait problem, neck pain and neck stiffness.  Neurological: Positive for headaches. Negative for dizziness, seizures, speech difficulty, weakness and numbness.  All other systems reviewed and are negative.    Physical Exam Updated Vital Signs BP 128/79 (BP Location: Right Arm)   Pulse 88   Temp 98.4 F (36.9 C) (Oral)   Resp 16   SpO2 99%   Physical Exam  Constitutional: He appears well-developed and well-nourished. No distress.  HENT:  Head: Normocephalic and atraumatic.  Mouth/Throat: Oropharynx is clear and moist.  Eyes: Conjunctivae and EOM are normal. Pupils are equal, round, and reactive to light.  Neck: Normal range of motion. Neck supple.  Cardiovascular: Normal rate, regular rhythm, S1 normal and S2 normal.  No murmur heard. Pulmonary/Chest: Effort normal and breath sounds normal. He has no wheezes. He has no rales.  Abdominal: Soft. He exhibits no distension. There is no tenderness. There is no guarding.  Musculoskeletal: Normal range of motion. He exhibits no edema or deformity.  Lymphadenopathy:    He has no cervical  adenopathy.  Neurological: He is alert. GCS eye subscore is 4. GCS verbal subscore is 5. GCS motor subscore is 6.  Mental Status:  Alert, oriented, thought content appropriate, able to give a coherent history. Speech fluent without evidence of aphasia. Able to follow 2 step commands without difficulty.  Cranial Nerves:  II:  Peripheral visual fields grossly normal, pupils equal,  round, reactive to light III,IV, VI: ptosis not present, extra-ocular motions intact bilaterally  V,VII: smile symmetric, facial light touch sensation equal VIII: hearing grossly normal to voice  X: uvula elevates symmetrically  XI: bilateral shoulder shrug symmetric and strong XII: midline tongue extension without fassiculations Motor:  Normal tone. 5/5 in upper and lower extremities bilaterally including strong and equal grip strength and dorsiflexion/plantar flexion Sensory: Pinprick and light touch normal in all extremities.  Deep Tendon Reflexes: 2+ and symmetric in the biceps and patella. No clonus. Cerebellar: normal finger-to-nose with bilateral upper extremities Gait: normal gait and balance Stance: Romberg negative. No pronator drift and good coordination, strength, and position sense with tapping of bilateral arms (performed in sitting position). CV: distal pulses palpable throughout   Skin: Skin is warm and dry. No rash noted. No erythema.  Psychiatric: He has a normal mood and affect. His behavior is normal. Judgment and thought content normal. His mood appears not anxious. He is not agitated.  Nursing note and vitals reviewed.    ED Treatments / Results  Labs (all labs ordered are listed, but only abnormal results are displayed) Labs Reviewed  CBC WITH DIFFERENTIAL/PLATELET  I-STAT CHEM 8, ED    EKG  EKG Interpretation None       Radiology No results found.  Procedures Procedures (including critical care time)  Medications Ordered in ED Medications  sodium chloride 0.9 % bolus 1,000 mL (not administered)  metoCLOPramide (REGLAN) injection 10 mg (not administered)  diphenhydrAMINE (BENADRYL) injection 12.5 mg (not administered)  ketorolac (TORADOL) 30 MG/ML injection 30 mg (not administered)  dexamethasone (DECADRON) injection 10 mg (not administered)     Initial Impression / Assessment and Plan / ED Course  I have reviewed the triage vital signs and  the nursing notes.  Pertinent labs & imaging results that were available during my care of the patient were reviewed by me and considered in my medical decision making (see chart for details).  Clinical Course as of Nov 21 1706  Wynelle Link Nov 21, 2017  1626 Patient reevaluated.  Patient reports that pain is improved.  Patient reports that pain is approximately 3-4 out of 10 in severity.  [AM]    Clinical Course User Index [AM] Elisha Ponder, PA-C    Final Clinical Impressions(s) / ED Diagnoses   Final diagnoses:  Left temporal headache   Patient is nontoxic-appearing, afebrile, and in no acute distress on examination.  Patient has no red flag features that have changed by his current presentation of headache.  Will initiate migraine cocktail as well as add on magnesium.  Patient without high-risk features of headache including: sudden onset/thunderclap HA, no similar headache in past, altered mental status, accompanying seizure, headache with exertion, age > 1, history of immunocompromise, neck or shoulder pain, fever, use of anticoagulation, family history of spontaneous SAH, concomitant drug use, toxic exposure.   Patient has a normal complete neurological exam, normal vital signs, normal level of consciousness, no signs of meningismus, is well-appearing/non-toxic appearing, no signs of trauma.  No pain over the temporal arteries.   Patient has had multiple CT scans of  the head since he has had similar headaches.  Last one in August 2018 demonstrates no mass lesion.  Given the chronicity of this headache, as well as no new neurologic features associated with it, do not feel that CT scan is warranted today.  This was discussed with Dr. Alona Bene.  Engaged in shared decision making with patient, and no further imaging performed today.  No dangerous or life-threatening conditions suspected or identified by history, physical exam, and by work-up. No indications for hospitalization identified.   I did discuss medication overuse with the patient, as he is using daily significant analgesia for his headache.  I encouraged him to try to limit the frequency of NSAIDs as well as acetaminophen use to treat his headache.  Patient is in the process of following up with [facility], and obtaining primary care.  Patient given return precautions for any worsening headache or neurologic features.  Patient is in understanding and agrees with the plan of care.  ED Discharge Orders    None       Delia Chimes 11/21/17 1710    Maia Plan, MD 11/21/17 2026

## 2017-11-21 NOTE — Discharge Instructions (Signed)
Please see the information and instructions below regarding your visit.  Your diagnoses today include:  1. Left temporal headache     You were seen and treated in the emergency department today for headache. Fortunately, your vitals, exam, and work-up is reassuring with no apparent emergent cause for your headache at this time.  Tests performed today include: See side panel of your discharge paperwork for testing performed today. Vital signs are listed at the bottom of these instructions.   Blood counts, electrolytes, and kidney function.  Medications prescribed:    Try to avoid daily or regular use of tylenol, aspirin, ibuprofen, and other overt-the-counter pain medications as this can contribute to rebound headaches.   Take any prescribed medications only as prescribed, and any over the counter medications only as directed on the packaging.  Home care instructions:   Drink plenty of fluids at home. This will help with your headache. Be cautious with caffeine use, as this can cause your headache to rebound when the effects wear off. If you drink more than 2 cups of coffee/caffeinated tea, or caffeinated soda per day, I suggest you wean down that amount.  Please follow any educational materials contained in this packet.   Follow-up instructions: Please follow-up with your primary care provider in as soon as possible for further evaluation of your symptoms if they are not completely improved.  I listed [facility], which is a primary care resource.  Please continue to follow-up with [facility] regarding her headaches.  Return instructions:  Please return to the Emergency Department if you experience worsening symptoms. It is VERY important that you monitor your symptoms at home. If you develop worsening headache, new fever, new neck stiffness, rash, focal weakness or numbness, or any other new or concerning symptoms, please return to the ED  immediately, as these may be signs that your headache has become a potentially serious and life-threatening condition.  Please return if you have any other emergent concerns.  Additional Information:   Your vital signs today were: BP 120/73    Pulse 74    Temp 98.4 F (36.9 C) (Oral)    Resp 16    SpO2 100%  If your blood pressure (BP) was elevated on multiple readings during this visit above 130 for the top number or above 80 for the bottom number, please have this repeated by your primary care provider within one month. --------------  Thank you for allowing us to participate in your care today.

## 2017-11-21 NOTE — ED Triage Notes (Addendum)
Pt c/o headache x 34 days, pain in L temporal area of head. Pt states he was seen in [facility] several weeks ago and given medications. Pt states he takes meds as directed but no relief. States he is having difficulty sleeping. Pt denies photosensitivity or visual problems.

## 2018-02-09 ENCOUNTER — Emergency Department (HOSPITAL_COMMUNITY)
Admission: EM | Admit: 2018-02-09 | Discharge: 2018-02-09 | Disposition: A | Discharge status: Home / Self Care | Payer: Self-pay | Attending: Emergency Medicine | Admitting: Emergency Medicine

## 2018-02-09 ENCOUNTER — Encounter (HOSPITAL_COMMUNITY): Payer: Self-pay | Admitting: Emergency Medicine

## 2018-02-09 DIAGNOSIS — L03115 Cellulitis of right lower limb: Secondary | ICD-10-CM | POA: Insufficient documentation

## 2018-02-09 DIAGNOSIS — Z23 Encounter for immunization: Secondary | ICD-10-CM | POA: Insufficient documentation

## 2018-02-09 DIAGNOSIS — L03119 Cellulitis of unspecified part of limb: Secondary | ICD-10-CM

## 2018-02-09 DIAGNOSIS — F17210 Nicotine dependence, cigarettes, uncomplicated: Secondary | ICD-10-CM | POA: Insufficient documentation

## 2018-02-09 MED ORDER — TETANUS-DIPHTH-ACELL PERTUSSIS 5-2.5-18.5 LF-MCG/0.5 IM SUSP
0.5000 mL | Freq: Once | INTRAMUSCULAR | Status: AC
  Administered 2018-02-09: 0.5 mL via INTRAMUSCULAR
  Filled 2018-02-09: qty 0.5

## 2018-02-09 MED ORDER — DOXYCYCLINE HYCLATE 100 MG PO CAPS: 100.0000 mg | ORAL_CAPSULE | Freq: Two times a day (BID) | ORAL | 0 refills | Status: AC

## 2018-02-09 NOTE — ED Provider Notes (Addendum)
[facility]-EMERGENCY DEPT Provider Note   CSN: [id number] Arrival date & time: 02/09/18  [id number]     History   Chief Complaint Chief Complaint  Patient presents with  . Insect Bite    HPI Clarita LeberWilliam F Godlewski is a 37 y.o. male.  HPI   Patient is a 37 year old male with no significant past medical history presenting for right foot pain.  Patient reports that 4 days ago, he felt something bite the dorsum of the right foot.  Patient reports that over the past couple days, he developed purulent drainage, but that has subsided.  Patient noting subsequently some erythema extending up the dorsum of the foot.  No injury occurred to the foot.  Patient denies any red streaking up the leg.  Patient denies any lower extremity edema or calf tenderness.  No fevers or chills.  Last Tetanus shot unknown.  History reviewed. No pertinent past medical history.  There are no active problems to display for this patient.   Past Surgical History:  Procedure Laterality Date  . APPENDECTOMY          Home Medications    Prior to Admission medications   Medication Sig Start Date End Date Taking? Authorizing Provider  aspirin 325 MG EC tablet Take 650 mg by mouth every 4 (four) hours as needed (Headache).   Yes [provider]  ibuprofen (ADVIL,MOTRIN) 800 MG tablet Take 800 mg by mouth daily as needed for moderate pain.   Yes [provider]  butalbital-acetaminophen-caffeine (FIORICET, ESGIC) 50-325-40 MG tablet Take 1 tablet by mouth every 6 (six) hours as needed for headache. Patient not taking: Reported on 02/09/2018 11/21/17 11/21/18  Elisha PonderMurray, Tashawna Thom B, PA-C    Family History Family History  Problem Relation Age of Onset  . Heart disease Father   . Heart attack Father   . Cancer Mother     Social History Social History   Tobacco Use  . Smoking status: Current Every Day Smoker    Packs/day: 0.50    Types: Cigarettes  . Smokeless tobacco: Never Used   Substance Use Topics  . Alcohol use: Yes    Comment: socially  . Drug use: No     Allergies   Hydrocodone   Review of Systems Review of Systems  Constitutional: Negative for chills and fever.  Cardiovascular: Negative for leg swelling.  Musculoskeletal: Positive for arthralgias. Negative for myalgias.  Skin: Positive for color change and wound.  Neurological: Negative for weakness and numbness.     Physical Exam Updated Vital Signs BP 134/85 (BP Location: Right Arm)   Pulse 81   Temp 98.1 F (36.7 C) (Oral)   Resp 18   Ht 6\' 2"  (1.88 m)   Wt 127 kg (280 lb)   SpO2 100%   BMI 35.95 kg/m   Physical Exam  Constitutional: He appears well-developed and well-nourished. No distress.  Sitting comfortably in bed.  HENT:  Head: Normocephalic and atraumatic.  Eyes: Conjunctivae are normal. Right eye exhibits no discharge. Left eye exhibits no discharge.  EOMs normal to gross examination.  Neck: Normal range of motion.  Cardiovascular: Normal rate and regular rhythm.  Intact, 2+ DP pulses bilaterally.  Pulmonary/Chest:  Normal respiratory effort. Patient converses comfortably. No audible wheeze or stridor.  Abdominal: He exhibits no distension.  Musculoskeletal: Normal range of motion.  Punctate abrasion with mild amount of soft tissue swelling and erythema in the third inter webspace of the right foot.  No fluctuance.  Mild  amount of induration.  No streaking up the leg.  Compartments of the right lower extremity are soft.  Patient is able to withstand passive flexion of all toes without difficulty.  Neurological: He is alert.  Cranial nerves intact to gross observation. Patient moves extremities without difficulty.  Skin: Skin is warm and dry. He is not diaphoretic.  Psychiatric: He has a normal mood and affect. His behavior is normal. Judgment and thought content normal.  Nursing note and vitals reviewed.    ED Treatments / Results  Labs (all labs ordered are  listed, but only abnormal results are displayed) Labs Reviewed - No data to display  EKG None  Radiology No results found.  Procedures Procedures (including critical care time)  Medications Ordered in ED Medications  Tdap (BOOSTRIX) injection 0.5 mL (0.5 mLs Intramuscular Given 02/09/18 1159)     Initial Impression / Assessment and Plan / ED Course  I have reviewed the triage vital signs and the nursing notes.  Pertinent labs & imaging results that were available during my care of the patient were reviewed by me and considered in my medical decision making (see chart for details).     Patient nontoxic-appearing, afebrile, and in no acute distress.  Patient with punctate abrasion to the third inter-webspace of the right foot with subsequent erythema.  No evidence of fluctuance.  Suspect early cellulitis.  No evidence of septic arthritis of the right foot, or compartment syndrome of the right foot.  No evidence of necrotizing tissue suggesting spider bite. No systemic symptoms. Tdap updated.  Doxycycline prescription dispensed.  Patient return precautions for any purulent drainage, spreading erythema, or fever chills.  Patient is understanding agrees with plan of care.  Final Clinical Impressions(s) / ED Diagnoses   Final diagnoses:  Cellulitis of foot    ED Discharge Orders        Ordered    doxycycline (VIBRAMYCIN) 100 MG capsule  2 times daily     02/09/18 1227       Delia Chimes 02/09/18 1730    Elisha Ponder, PA-C 02/09/18 1731    Charlynne Pander, MD 02/10/18 1126

## 2018-02-09 NOTE — Discharge Instructions (Signed)
Please see the information and instructions below regarding your visit.  Your diagnoses today include:  1. Cellulitis of foot    Cellulitis is a superficial skin infection. Please take your antibiotics as prescribed for their ENTIRE prescribed duration.   Tests performed today include: See side panel of your discharge paperwork for testing performed today. Vital signs are listed at the bottom of these instructions.   Medications prescribed:    Take any prescribed medications only as prescribed, and any over the counter medications only as directed on the packaging.  1. Doxycycline is an antibiotic that fights infection in the skin. This medication can make your skin sensitive to the sun, so please ensure that you wear sunscreen, hats, or other coverage over your skin while taking this. This medicine CANNOT be taken by women while pregnant, breastfeeding, or trying to become pregnant.  Please speak with a healthcare provider if any of these situations apply to you.   Home care instructions:  Please follow any educational materials contained in this packet.    Keep affected area above the level of your heart when possible. Wash area gently twice a day with warm soapy water. Do not apply alcohol or hydrogen peroxide. Cover the area if it draining or weeping.   Follow-up instructions: Please follow-up with your primary care provider or the ED in 48 hours for a check of the infection if symptoms are not improving.   Return instructions:  Please return to the Emergency Department if you experience worsening symptoms. Call your doctor sooner or return to the ER if you develop worsening signs of infection such as: increased redness, increased pain, pus, fever, or other symptoms that concern you. Please monitor the area we marked with a pen today. Please return if you have any other emergent concerns.  Additional Information:   Your vital signs today were: BP 134/85 (BP Location: Right Arm)     Pulse 81    Temp 98.1 F (36.7 C) (Oral)    Resp 18    Ht 6\' 2"  (1.88 m)    Wt 127 kg (280 lb)    SpO2 100%    BMI 35.95 kg/m  If your blood pressure (BP) was elevated on multiple readings during this visit above 130 for the top number or above 80 for the bottom number, please have this repeated by your primary care provider within one month. --------------  Thank you for allowing us to participate in your care today. It was a pleasure taking care of you today!

## 2019-09-20 ENCOUNTER — Emergency Department (HOSPITAL_COMMUNITY)
Admission: EM | Admit: 2019-09-20 | Discharge: 2019-09-20 | Disposition: A | Discharge status: Home / Self Care | Payer: Self-pay | Attending: Emergency Medicine | Admitting: Emergency Medicine

## 2019-09-20 ENCOUNTER — Encounter (HOSPITAL_COMMUNITY): Payer: Self-pay

## 2019-09-20 ENCOUNTER — Other Ambulatory Visit: Payer: Self-pay

## 2019-09-20 DIAGNOSIS — R519 Headache, unspecified: Secondary | ICD-10-CM | POA: Insufficient documentation

## 2019-09-20 DIAGNOSIS — F17210 Nicotine dependence, cigarettes, uncomplicated: Secondary | ICD-10-CM | POA: Insufficient documentation

## 2019-09-20 MED ORDER — KETOROLAC TROMETHAMINE 30 MG/ML IJ SOLN
30.0000 mg | Freq: Once | INTRAMUSCULAR | Status: AC
  Administered 2019-09-20: 30 mg via INTRAVENOUS
  Filled 2019-09-20: qty 1

## 2019-09-20 MED ORDER — SODIUM CHLORIDE 0.9 % IV BOLUS
1000.0000 mL | Freq: Once | INTRAVENOUS | Status: AC
  Administered 2019-09-20: 1000 mL via INTRAVENOUS

## 2019-09-20 MED ORDER — METOCLOPRAMIDE HCL 5 MG/ML IJ SOLN
10.0000 mg | Freq: Once | INTRAMUSCULAR | Status: AC
  Administered 2019-09-20: 10 mg via INTRAVENOUS
  Filled 2019-09-20: qty 2

## 2019-09-20 MED ORDER — LIDOCAINE 4 % EX CREA: 1.0000 "application " | TOPICAL_CREAM | Freq: Three times a day (TID) | CUTANEOUS | 0 refills | Status: DC | PRN

## 2019-09-20 NOTE — ED Notes (Signed)
ED PA at bedside with patient. 

## 2019-09-20 NOTE — ED Triage Notes (Addendum)
C/o headache that started Saturday morning on the upper left side of head that has progressed to a severe headache.   8/10 intermittent sharp  Patient states he has a history of migraines.   Denies n/v    A/Ox4 Ambulatory in triage.

## 2019-09-20 NOTE — Discharge Instructions (Signed)
You can take 1 to 2 tablets of Tylenol (350mg -1000mg  depending on the dose) every 6 hours as needed for pain.  Do not exceed 4000 mg of Tylenol daily.  If your pain persists you can take a doses of ibuprofen in between doses of Tylenol.  I usually recommend 400 to 600 mg of ibuprofen every 6 hours.  Take this with food to avoid upset stomach issues.  Apply lidocaine cream to areas of tenderness as needed.  Drink plenty of fluids and get plenty of rest.  Call [facility] tomorrow to schedule follow-up appointment and establish primary care services.  Tell them you were referred from the emergency department.  They have 2 locations, ask to get scheduled wherever they have soonest availability.  Return to the emergency department if any concerning signs or symptoms develop such as severe headache, vision changes, persistent vomiting, fevers, neck stiffness, weakness to 1 side of the body, loss of consciousness, or altered mental status.

## 2019-09-20 NOTE — ED Provider Notes (Signed)
[facility]-EMERGENCY DEPT Provider Note   CSN: [id number] Arrival date & time: 09/20/19  1407     History   Chief Complaint Chief Complaint  Patient presents with  . Headache    HPI Jared LeberWilliam F Humphrey is a 38 y.o. male presents today for evaluation of gradual onset, intermittent left-sided headaches for 4 days.  Reports symptoms began at 10 AM on Saturday sitting watching television.  Reports the pain feels sharp stabbing "almost like a nerve pain".  Reports that he is able to reproduce the pain by pressing on a specific portion of his scalp very lightly.  Denies vision changes, phonophobia, photophobia, neck pain or stiffness, fevers, numbness or weakness of the extremities, nausea, vomiting, difficulty swallowing or ambulating.  He has taken ibuprofen and Tylenol without relief of symptoms.  Reports he has had similar headaches in the past but they have not been quite so persistent and usually are not reproducible on palpation.  Headache worsens with talking or turning his head to the right.  No recent travel.    The history is provided by the patient.    History reviewed. No pertinent past medical history.  There are no active problems to display for this patient.   Past Surgical History:  Procedure Laterality Date  . APPENDECTOMY          Home Medications    Prior to Admission medications   Medication Sig Start Date End Date Taking? Authorizing Provider  aspirin 325 MG EC tablet Take 650 mg by mouth every 4 (four) hours as needed (Headache).    [provider]  ibuprofen (ADVIL,MOTRIN) 800 MG tablet Take 800 mg by mouth daily as needed for moderate pain.    [provider]  lidocaine (LMX) 4 % cream Apply 1 application topically 3 (three) times daily as needed. 09/20/19   Jeanie SewerFawze, [place] Jaquith A, PA-C    Family History Family History  Problem Relation Age of Onset  . Heart disease Father   . Heart attack Father   . Cancer Mother      Social History Social History   Tobacco Use  . Smoking status: Current Every Day Smoker    Packs/day: 0.50    Types: Cigarettes  . Smokeless tobacco: Never Used  Substance Use Topics  . Alcohol use: Yes    Comment: socially  . Drug use: No     Allergies   Hydrocodone   Review of Systems Review of Systems  Constitutional: Negative for chills and fever.  Eyes: Negative for photophobia and visual disturbance.  Respiratory: Negative for shortness of breath.   Cardiovascular: Negative for chest pain.  Gastrointestinal: Negative for abdominal pain, nausea and vomiting.  Musculoskeletal: Negative for neck pain and neck stiffness.  Neurological: Positive for headaches. Negative for syncope, weakness, light-headedness and numbness.  All other systems reviewed and are negative.    Physical Exam Updated Vital Signs BP (!) 143/79 (BP Location: Left Arm)   Pulse 81   Temp 98.5 F (36.9 C) (Oral)   Resp 19   SpO2 99%   Physical Exam Vitals signs and nursing note reviewed.  Constitutional:      General: He is not in acute distress.    Appearance: He is well-developed.  HENT:     Head: Normocephalic and atraumatic.     Jaw: There is normal jaw occlusion. No trismus.      Comments: Tenderness to palpation of the left frontoparietal region, no erythema swelling or crepitus noted.  No deformity or ecchymosis.  No tenderness to palpation of the temples. Eyes:     General:        Right eye: No discharge.        Left eye: No discharge.     Extraocular Movements: Extraocular movements intact.     Right eye: Normal extraocular motion and no nystagmus.     Left eye: Normal extraocular motion and no nystagmus.     Conjunctiva/sclera: Conjunctivae normal.     Pupils: Pupils are equal, round, and reactive to light. Pupils are equal.  Neck:     Musculoskeletal: Normal range of motion and neck supple. No neck rigidity.     Vascular: No JVD.     Trachea: No tracheal deviation.   Cardiovascular:     Rate and Rhythm: Normal rate and regular rhythm.  Pulmonary:     Effort: Pulmonary effort is normal.     Breath sounds: Normal breath sounds.  Abdominal:     General: Bowel sounds are normal. There is no distension.     Palpations: Abdomen is soft.     Tenderness: There is no abdominal tenderness.  Skin:    Findings: No erythema.  Neurological:     Mental Status: He is alert and oriented to person, place, and time.     GCS: GCS eye subscore is 4. GCS verbal subscore is 5. GCS motor subscore is 6.     Cranial Nerves: No cranial nerve deficit.     Sensory: No sensory deficit.     Comments: Mental Status:  Alert, thought content appropriate, able to give a coherent history. Speech fluent without evidence of aphasia. Able to follow 2 step commands without difficulty.  Cranial Nerves:  II:  Peripheral visual fields grossly normal, pupils equal, round, reactive to light III,IV, VI: ptosis not present, extra-ocular motions intact bilaterally  V,VII: smile symmetric, facial light touch sensation equal VIII: hearing grossly normal to voice  X: uvula elevates symmetrically  XI: bilateral shoulder shrug symmetric and strong XII: midline tongue extension without fassiculations Motor:  Normal tone. 5/5 strength of BUE and BLE major muscle groups including strong and equal grip strength and dorsiflexion/plantar flexion Sensory: light touch normal in all extremities. Cerebellar: normal finger-to-nose with bilateral upper extremities Gait: normal gait and balance. Able to walk on toes and heels with ease, Romberg sign absent    Psychiatric:        Behavior: Behavior normal.      ED Treatments / Results  Labs (all labs ordered are listed, but only abnormal results are displayed) Labs Reviewed - No data to display  EKG None  Radiology No results found.  Procedures Procedures (including critical care time)  Medications Ordered in ED Medications  ketorolac  (TORADOL) 30 MG/ML injection 30 mg (30 mg Intravenous Given 09/20/19 1555)  metoCLOPramide (REGLAN) injection 10 mg (10 mg Intravenous Given 09/20/19 1554)  sodium chloride 0.9 % bolus 1,000 mL (0 mLs Intravenous Stopped 09/20/19 1656)     Initial Impression / Assessment and Plan / ED Course  I have reviewed the triage vital signs and the nursing notes.  Pertinent labs & imaging results that were available during my care of the patient were reviewed by me and considered in my medical decision making (see chart for details).        Patient presenting for evaluation of left-sided headache for 4 days.  He is afebrile, initially mildly tachycardic on triage but vital signs improved on subsequent reevaluations.  He  has a normal neurologic examination with no focal neurologic deficits.  The headache is reproducible on palpation of a focal area of the left side of the scalp with no signs of secondary skin infection.  No tenderness to palpation of the temples, no vision changes.  Doubt giant cell arteritis or temporal arteritis.  Given duration of symptoms, the fact that his headache is intermittent, and the fact that his headache is reproducible on palpation I doubt subarachnoid hemorrhage or other intracranial hemorrhage.  Doubt CVA in the absence of any neuro deficits on examination.  No fever or meningeal signs to suggest meningitis.  Considered occipital neuralgia.  He was given Toradol Reglan and IV fluids in the ED and reports headache improved from 8/10 in severity to 5/10 severity of feeling much better feels comfortable with discharge home.  We had a shared decision-making conversation regarding the utility of head imaging at this time and he feels comfortable foregoing it which I think is reasonable.  Recommend follow-up with PCP or neurology on an outpatient basis for reevaluation of symptoms.  Discussed strict ED return precautions. Patient verbalized understanding of and agreement with plan and  is safe for discharge home at this time.   Final Clinical Impressions(s) / ED Diagnoses   Final diagnoses:  Left-sided headache  Scalp tenderness    ED Discharge Orders         Ordered    lidocaine (LMX) 4 % cream  3 times daily PRN     09/20/19 1723           Renita Papa, PA-C 09/20/19 1749    Lucrezia Starch, MD 09/21/19 1512

## 2019-09-20 NOTE — ED Notes (Signed)
Patient has ambulated to restroom without complication or assistance.

## 2020-01-13 ENCOUNTER — Other Ambulatory Visit: Payer: Self-pay

## 2020-01-13 ENCOUNTER — Ambulatory Visit
Admission: EM | Admit: 2020-01-13 | Discharge: 2020-01-13 | Disposition: A | Discharge status: Home / Self Care | Payer: Self-pay | Attending: Emergency Medicine | Admitting: Emergency Medicine

## 2020-01-13 ENCOUNTER — Encounter: Payer: Self-pay | Admitting: Emergency Medicine

## 2020-01-13 DIAGNOSIS — F17200 Nicotine dependence, unspecified, uncomplicated: Secondary | ICD-10-CM

## 2020-01-13 DIAGNOSIS — J029 Acute pharyngitis, unspecified: Secondary | ICD-10-CM | POA: Insufficient documentation

## 2020-01-13 DIAGNOSIS — Z20822 Contact with and (suspected) exposure to covid-19: Secondary | ICD-10-CM

## 2020-01-13 LAB — POCT RAPID STREP A (OFFICE): Rapid Strep A Screen: NEGATIVE

## 2020-01-13 MED ORDER — LIDOCAINE VISCOUS HCL 2 % MT SOLN: 15.0000 mL | OROMUCOSAL | 0 refills | Status: DC | PRN

## 2020-01-13 NOTE — ED Triage Notes (Signed)
Pt presents to [facility] for assessment of 2 days of sore throat.  Currently a 3/4 pack a day smoker.

## 2020-01-13 NOTE — Discharge Instructions (Signed)
Your rapid strep test was negative today.  The culture is pending.  Please look on your MyChart for test results.   We will notify you if the culture positive and outline a treatment plan at that time.   Please continue Tylenol and/or Ibuprofen as needed for fever, pain.  May try warm salt water gargles, cepacol lozenges, throat spray, warm tea or water with lemon/honey, or OTC cold relief medicine for throat discomfort.

## 2020-01-13 NOTE — ED Notes (Signed)
Patient able to ambulate independently  

## 2020-01-13 NOTE — ED Provider Notes (Signed)
[facility]    CSN: [id number] Arrival date & time: 01/13/20  1405      History   Chief Complaint Chief Complaint  Patient presents with  . Sore Throat    HPI Jared Humphrey is a 39 y.o. male presenting for 2-day course of sore throat.  Patient currently smoking 3/4 PPD x25 years.  Denies dysphagia, drooling, dental pain, ear pain, fever, chills, myalgias or arthralgias.  No chest pain, cough, shortness of breath.  Patient denies known sick exposures.  Has not taken anything for symptoms.   History reviewed. No pertinent past medical history.  There are no problems to display for this patient.   Past Surgical History:  Procedure Laterality Date  . APPENDECTOMY         Home Medications    Prior to Admission medications   Medication Sig Start Date End Date Taking? Authorizing Provider  ibuprofen (ADVIL,MOTRIN) 800 MG tablet Take 800 mg by mouth daily as needed for moderate pain.    [provider]  lidocaine (XYLOCAINE) 2 % solution Use as directed 15 mLs in the mouth or throat as needed for mouth pain. 01/13/20   Hall-Potvin, [place], PA-C    Family History Family History  Problem Relation Age of Onset  . Heart disease Father   . Heart attack Father   . Cancer Mother     Social History Social History   Tobacco Use  . Smoking status: Current Every Day Smoker    Packs/day: 0.50    Types: Cigarettes  . Smokeless tobacco: Never Used  Substance Use Topics  . Alcohol use: Yes    Comment: socially  . Drug use: No     Allergies   Hydrocodone   Review of Systems As per HPI   Physical Exam Triage Vital Signs ED Triage Vitals  Enc Vitals Group     BP      Pulse      Resp      Temp      Temp src      SpO2      Weight      Height      Head Circumference      Peak Flow      Pain Score      Pain Loc      Pain Edu?      Excl. in [place]?    No data found.  Updated Vital Signs BP 131/86 (BP Location: Right Arm)   Pulse  98   Temp 98 F (36.7 C) (Temporal)   Resp 18   SpO2 97%   Visual Acuity Right Eye Distance:   Left Eye Distance:   Bilateral Distance:    Right Eye Near:   Left Eye Near:    Bilateral Near:     Physical Exam Constitutional:      General: He is not in acute distress.    Appearance: He is well-developed. He is obese. He is not ill-appearing.  HENT:     Head: Normocephalic and atraumatic.     Right Ear: Tympanic membrane, ear canal and external ear normal.     Left Ear: Tympanic membrane, ear canal and external ear normal.     Nose: No nasal deformity, congestion or rhinorrhea.     Mouth/Throat:     Mouth: Mucous membranes are moist.     Tongue: Tongue does not deviate from midline.     Pharynx: Oropharynx is clear. Uvula midline.  Tonsils: Tonsillar exudate present. No tonsillar abscesses. 2+ on the right. 2+ on the left.  Eyes:     General: No scleral icterus.    Conjunctiva/sclera: Conjunctivae normal.     Pupils: Pupils are equal, round, and reactive to light.  Neck:     Comments: No TTP Cardiovascular:     Rate and Rhythm: Normal rate and regular rhythm.     Heart sounds: No murmur. No gallop.   Pulmonary:     Effort: Pulmonary effort is normal. No respiratory distress.     Breath sounds: No wheezing or rales.  Musculoskeletal:     Cervical back: Normal range of motion and neck supple. No muscular tenderness.  Lymphadenopathy:     Cervical: Cervical adenopathy present.  Skin:    General: Skin is warm.     Capillary Refill: Capillary refill takes less than 2 seconds.     Coloration: Skin is not pale.     Findings: No erythema.  Neurological:     Mental Status: He is alert.      UC Treatments / Results  Labs (all labs ordered are listed, but only abnormal results are displayed) Labs Reviewed  POCT RAPID STREP A (OFFICE) - Normal  CULTURE, GROUP A STREP ([facility])  NOVEL CORONAVIRUS, NAA    EKG   Radiology No results found.  Procedures Procedures  (including critical care time)  Medications Ordered in UC Medications - No data to display  Initial Impression / Assessment and Plan / UC Course  I have reviewed the triage vital signs and the nursing notes.  Pertinent labs & imaging results that were available during my care of the patient were reviewed by me and considered in my medical decision making (see chart for details).     Patient afebrile, nontoxic in office today.  Rapid strep: Negative-culture pending.  Will treat supportively as outlined below.  Covid PCR pending.  Return precautions discussed, patient verbalized understanding and is agreeable to plan. Final Clinical Impressions(s) / UC Diagnoses   Final diagnoses:  Sore throat     Discharge Instructions     Your rapid strep test was negative today.  The culture is pending.  Please look on your MyChart for test results.   We will notify you if the culture positive and outline a treatment plan at that time.   Please continue Tylenol and/or Ibuprofen as needed for fever, pain.  May try warm salt water gargles, cepacol lozenges, throat spray, warm tea or water with lemon/honey, or OTC cold relief medicine for throat discomfort.     ED Prescriptions    Medication Sig Dispense Auth. Provider   lidocaine (XYLOCAINE) 2 % solution Use as directed 15 mLs in the mouth or throat as needed for mouth pain. 100 mL Hall-Potvin, [place], PA-C     PDMP not reviewed this encounter.   Hall-Potvin, [place], [place] 01/13/20 1503

## 2020-01-14 LAB — NOVEL CORONAVIRUS, NAA: SARS-CoV-2, NAA: NOT DETECTED

## 2020-01-18 LAB — CULTURE, GROUP A STREP (THRC)

## 2020-07-31 ENCOUNTER — Other Ambulatory Visit: Payer: Self-pay

## 2020-07-31 ENCOUNTER — Encounter (HOSPITAL_COMMUNITY): Payer: Self-pay

## 2020-07-31 ENCOUNTER — Emergency Department (HOSPITAL_COMMUNITY)
Admission: EM | Admit: 2020-07-31 | Discharge: 2020-07-31 | Disposition: A | Discharge status: Home / Self Care | Payer: Self-pay | Attending: Emergency Medicine | Admitting: Emergency Medicine

## 2020-07-31 ENCOUNTER — Encounter (HOSPITAL_COMMUNITY): Payer: Self-pay | Admitting: *Deleted

## 2020-07-31 ENCOUNTER — Emergency Department (HOSPITAL_BASED_OUTPATIENT_CLINIC_OR_DEPARTMENT_OTHER): Payer: Self-pay

## 2020-07-31 DIAGNOSIS — F17210 Nicotine dependence, cigarettes, uncomplicated: Secondary | ICD-10-CM | POA: Insufficient documentation

## 2020-07-31 DIAGNOSIS — L03115 Cellulitis of right lower limb: Secondary | ICD-10-CM | POA: Insufficient documentation

## 2020-07-31 DIAGNOSIS — M7989 Other specified soft tissue disorders: Secondary | ICD-10-CM

## 2020-07-31 DIAGNOSIS — L538 Other specified erythematous conditions: Secondary | ICD-10-CM

## 2020-07-31 DIAGNOSIS — M79609 Pain in unspecified limb: Secondary | ICD-10-CM

## 2020-07-31 LAB — CBC WITH DIFFERENTIAL/PLATELET
Abs Immature Granulocytes: 0.07 10*3/uL (ref 0.00–0.07)
Basophils Absolute: 0.1 10*3/uL (ref 0.0–0.1)
Basophils Relative: 0 %
Eosinophils Absolute: 0 10*3/uL (ref 0.0–0.5)
Eosinophils Relative: 0 %
HCT: 47.7 % (ref 39.0–52.0)
Hemoglobin: 16.4 g/dL (ref 13.0–17.0)
Immature Granulocytes: 0 %
Lymphocytes Relative: 10 %
Lymphs Abs: 1.8 10*3/uL (ref 0.7–4.0)
MCH: 33.1 pg (ref 26.0–34.0)
MCHC: 34.4 g/dL (ref 30.0–36.0)
MCV: 96.2 fL (ref 80.0–100.0)
Monocytes Absolute: 1.4 10*3/uL — ABNORMAL HIGH (ref 0.1–1.0)
Monocytes Relative: 8 %
Neutro Abs: 13.8 10*3/uL — ABNORMAL HIGH (ref 1.7–7.7)
Neutrophils Relative %: 82 %
Platelets: 276 10*3/uL (ref 150–400)
RBC: 4.96 MIL/uL (ref 4.22–5.81)
RDW: 12.6 % (ref 11.5–15.5)
WBC: 17.2 10*3/uL — ABNORMAL HIGH (ref 4.0–10.5)
nRBC: 0 % (ref 0.0–0.2)

## 2020-07-31 LAB — BASIC METABOLIC PANEL
Anion gap: 9 (ref 5–15)
BUN: 7 mg/dL (ref 6–20)
CO2: 26 mmol/L (ref 22–32)
Calcium: 9.3 mg/dL (ref 8.9–10.3)
Chloride: 99 mmol/L (ref 98–111)
Creatinine, Ser: 0.89 mg/dL (ref 0.61–1.24)
GFR calc Af Amer: >60 mL/min (ref >60)
GFR calc non Af Amer: >60 mL/min (ref >60)
Glucose, Bld: 108 mg/dL — ABNORMAL HIGH (ref 70–99)
Potassium: 3.8 mmol/L (ref 3.5–5.1)
Sodium: 134 mmol/L — ABNORMAL LOW (ref 135–145)

## 2020-07-31 MED ORDER — DEXTROSE 5 % IV SOLN
1500.0000 mg | Freq: Once | INTRAVENOUS | Status: AC
  Administered 2020-07-31: 1500 mg via INTRAVENOUS
  Filled 2020-07-31: qty 75

## 2020-07-31 MED ORDER — SODIUM CHLORIDE 0.9 % IV SOLN: INTRAVENOUS | Status: DC

## 2020-07-31 MED ORDER — CEFAZOLIN SODIUM-DEXTROSE 1-4 GM/50ML-% IV SOLN
1.0000 g | Freq: Once | INTRAVENOUS | Status: AC
  Administered 2020-07-31: 12:00:00 1 g via INTRAVENOUS
  Filled 2020-07-31: qty 50

## 2020-07-31 MED ORDER — OXYCODONE-ACETAMINOPHEN 5-325 MG PO TABS
2.0000 | ORAL_TABLET | Freq: Once | ORAL | Status: AC
  Administered 2020-07-31: 16:00:00 2 via ORAL
  Filled 2020-07-31: qty 2

## 2020-07-31 NOTE — Progress Notes (Signed)
Lower extremity venous RT study completed.  Preliminary results relayed to Ray, MD.   See CV Proc for preliminary results report.   Jean Rosenthal, RDMS

## 2020-07-31 NOTE — Discharge Instructions (Addendum)
You received long-acting IV antibiotics here in the emergency department Return to the emergency department if you have worsening symptoms especially increased redness, discharge, swelling, or inability to keep fluids down. You should be reevaluated in 1 to 2 days

## 2020-07-31 NOTE — ED Triage Notes (Signed)
Pt states he developed pain in his rt leg a week ago without injury, Yesterday difficult to walk on. He has redness with bruise to inner rt lower leg.

## 2020-07-31 NOTE — ED Provider Notes (Signed)
[facility]-EMERGENCY DEPT Provider Note   CSN: [id number] Arrival date & time: 07/31/20  1011     History Chief Complaint  Patient presents with  . Leg Pain    Jared Humphrey is a 39 y.o. male.  HPI    39 year old male presents to the ED complaining of right lower extremity redness and pain.  Pain began approximately 1 week ago.  He has noticed increased redness over the past 24 hours.  Redness has spread spread proximally to almost the knee.  Not any history of blood clots in his legs or lungs.  He is not having any dyspnea.  Had no trauma to this area.  Pain worsens with trying to walk. History reviewed. No pertinent past medical history.  There are no problems to display for this patient.   Past Surgical History:  Procedure Laterality Date  . APPENDECTOMY         Family History  Problem Relation Age of Onset  . Heart disease Father   . Heart attack Father   . Cancer Mother     Social History   Tobacco Use  . Smoking status: Current Every Day Smoker    Packs/day: 0.50    Types: Cigarettes  . Smokeless tobacco: Never Used  Vaping Use  . Vaping Use: Never used  Substance Use Topics  . Alcohol use: Yes    Comment: socially  . Drug use: No    Home Medications Prior to Admission medications   Medication Sig Start Date End Date Taking? Authorizing Provider  lidocaine (XYLOCAINE) 2 % solution Use as directed 15 mLs in the mouth or throat as needed for mouth pain. Patient not taking: Reported on 07/31/2020 01/13/20   Hall-Potvin, [place], PA-C    Allergies    Hydrocodone  Review of Systems   Review of Systems  All other systems reviewed and are negative.   Physical Exam Updated Vital Signs BP 139/63   Pulse 100   Temp 99.5 F (37.5 C) (Oral)   Resp 16   Ht 1.88 m (6\' 2" )   Wt 135.2 kg   SpO2 97%   BMI 38.26 kg/m   Physical Exam Vitals and nursing note reviewed.  Constitutional:      Appearance: Normal appearance.  He is obese.  HENT:     Head: Normocephalic.     Right Ear: External ear normal.     Left Ear: External ear normal.     Nose: Nose normal.     Mouth/Throat:     Mouth: Mucous membranes are moist.     Pharynx: Oropharynx is clear.  Eyes:     Pupils: Pupils are equal, round, and reactive to light.  Cardiovascular:     Rate and Rhythm: Normal rate and regular rhythm.     Pulses: Normal pulses.  Pulmonary:     Effort: Pulmonary effort is normal.     Breath sounds: Normal breath sounds.  Abdominal:     General: Abdomen is flat.     Palpations: Abdomen is soft.  Musculoskeletal:        General: Normal range of motion.     Cervical back: Normal range of motion.     Comments: Erythema proximal lower leg from below the knee down to third approximately 13 x 6 cm Some diffuse tenderness to palpation but no fluctuance noted  Skin:    General: Skin is warm.     Capillary Refill: Capillary refill takes less than 2 seconds.  Neurological:     General: No focal deficit present.     Mental Status: He is alert.  Psychiatric:        Mood and Affect: Mood normal.        Behavior: Behavior normal.     ED Results / Procedures / Treatments   Labs (all labs ordered are listed, but only abnormal results are displayed) Labs Reviewed  BASIC METABOLIC PANEL - Abnormal; Notable for the following components:      Result Value   Sodium 134 (*)    Glucose, Bld 108 (*)    All other components within normal limits  CBC WITH DIFFERENTIAL/PLATELET - Abnormal; Notable for the following components:   WBC 17.2 (*)    Neutro Abs 13.8 (*)    Monocytes Absolute 1.4 (*)    All other components within normal limits  CULTURE, BLOOD (ROUTINE X 2)    EKG None  Radiology VAS [place] LOWER EXTREMITY VENOUS (DVT)  Result Date: 07/31/2020  Lower Venous DVTStudy Indications: Pain, Erythema, and Swelling.  Limitations: Poor ultrasound/tissue interface and patient intolerant to probe pressure/compressions.  Comparison Study: No prior studies. Performing Technologist: Jean Rosenthal, RDMS  Examination Guidelines: A complete evaluation includes B-mode imaging, spectral Doppler, color Doppler, and power Doppler as needed of all accessible portions of each vessel. Bilateral testing is considered an integral part of a complete examination. Limited examinations for reoccurring indications may be performed as noted. The reflux portion of the exam is performed with the patient in reverse Trendelenburg.  +---------+---------------+---------+-----------+----------+---------------+ RIGHT    CompressibilityPhasicitySpontaneityPropertiesThrombus Aging  +---------+---------------+---------+-----------+----------+---------------+ CFV      Full           Yes      Yes                                  +---------+---------------+---------+-----------+----------+---------------+ SFJ      Full                                                         +---------+---------------+---------+-----------+----------+---------------+ FV Prox  Full                                                         +---------+---------------+---------+-----------+----------+---------------+ FV Mid   Full                                                         +---------+---------------+---------+-----------+----------+---------------+ FV DistalFull                                                         +---------+---------------+---------+-----------+----------+---------------+ PFV      Full                                                         +---------+---------------+---------+-----------+----------+---------------+  POP      Full           Yes      Yes                                  +---------+---------------+---------+-----------+----------+---------------+ PTV      Full                                                          +---------+---------------+---------+-----------+----------+---------------+ PERO                    Yes      Yes                  Patent by color +---------+---------------+---------+-----------+----------+---------------+ Gastroc  Full                                                         +---------+---------------+---------+-----------+----------+---------------+   Right Technical Findings: No obvious evidence of abscess noted.  +----+---------------+---------+-----------+----------+--------------+ LEFTCompressibilityPhasicitySpontaneityPropertiesThrombus Aging +----+---------------+---------+-----------+----------+--------------+ CFV Full           Yes      Yes                                 +----+---------------+---------+-----------+----------+--------------+     Summary: RIGHT: - There is no evidence of deep vein thrombosis in the lower extremity. However, portions of this examination were limited- see technologist comments above.  - No cystic structure found in the popliteal fossa. - Ultrasound characteristics of enlarged lymph nodes are noted in the groin.  LEFT: - No evidence of common femoral vein obstruction.  *See table(s) above for measurements and observations.    Preliminary     Procedures Procedures (including critical care time)  Medications Ordered in ED Medications  0.9 %  sodium chloride infusion ( Intravenous New Bag/Given 07/31/20 1153)  dalbavancin (DALVANCE) 1,500 mg in dextrose 5 % 500 mL IVPB (has no administration in time range)  ceFAZolin (ANCEF) IVPB 1 g/50 mL premix (0 g Intravenous Stopped 07/31/20 1231)    ED Course  I have reviewed the triage vital signs and the nursing notes.  Pertinent labs & imaging results that were available during my care of the patient were reviewed by me and considered in my medical decision making (see chart for details).    MDM Rules/Calculators/A&P                         Patient seen and evaluated in the  ED.  Patient has cellulitis of the right lower extremity.  Otherwise patient is healthy.  Doppler obtained does not show any evidence of DVT. Patient appears to be good candidate for dalvance -discussed with patient and advised  Final Clinical Impression(s) / ED Diagnoses Final diagnoses:  Cellulitis of right lower extremity    Rx / DC Orders ED Discharge Orders         Ordered    Ambulatory referral to Infectious Disease  Comments: Cellulitis patient:  Received dalbavancin on 07/31/2020.   07/31/20 1542           Margarita Grizzleay, Maki Sweetser, MD 07/31/20 1558

## 2020-08-05 ENCOUNTER — Other Ambulatory Visit: Payer: Self-pay

## 2020-08-05 ENCOUNTER — Encounter: Payer: Self-pay | Admitting: Emergency Medicine

## 2020-08-05 ENCOUNTER — Ambulatory Visit
Admission: EM | Admit: 2020-08-05 | Discharge: 2020-08-05 | Disposition: A | Discharge status: Home / Self Care | Payer: Self-pay | Attending: Emergency Medicine | Admitting: Emergency Medicine

## 2020-08-05 DIAGNOSIS — M79604 Pain in right leg: Secondary | ICD-10-CM

## 2020-08-05 DIAGNOSIS — D72829 Elevated white blood cell count, unspecified: Secondary | ICD-10-CM

## 2020-08-05 LAB — CULTURE, BLOOD (ROUTINE X 2)
Culture: NO GROWTH
Special Requests: ADEQUATE

## 2020-08-05 MED ORDER — DOXYCYCLINE HYCLATE 100 MG PO CAPS: 100.0000 mg | ORAL_CAPSULE | Freq: Two times a day (BID) | ORAL | 0 refills | Status: AC

## 2020-08-05 MED ORDER — MELOXICAM 7.5 MG PO TABS: 7.5000 mg | ORAL_TABLET | Freq: Every day | ORAL | 0 refills | Status: DC

## 2020-08-05 NOTE — Discharge Instructions (Signed)
Keep appointment with ID on Wednesday at 9:15am! Take antibiotic 2 times a day with food.

## 2020-08-05 NOTE — ED Triage Notes (Signed)
PT states he has been having right leg pain x 10 days. Pt went to another facility and received IV antibiotics and pain meds but has not gotten it to resolve. Pt is aox4 and ambulatory.

## 2020-08-05 NOTE — ED Provider Notes (Signed)
[facility]    CSN: [id number] Arrival date & time: 08/05/20  0847      History   Chief Complaint Chief Complaint  Patient presents with  . Leg Pain    right leg    HPI Jared Humphrey is a 39 y.o. male  Presenting for persistent right leg pain.  Onset over 10 days ago.  Seen in ER 07/31/2020 for the same: Please see those records, reviewed by me at time of visit.  Since ER discharge, has not been on antibiotics or pain medication.  Overall feels it slightly better.  Denies fever, thrashes, myalgias, malaise, headaches, chest pain, difficulty breathing, worsening swelling.  History reviewed. No pertinent past medical history.  There are no problems to display for this patient.   Past Surgical History:  Procedure Laterality Date  . APPENDECTOMY         Home Medications    Prior to Admission medications   Medication Sig Start Date End Date Taking? Authorizing Provider  doxycycline (VIBRAMYCIN) 100 MG capsule Take 1 capsule (100 mg total) by mouth 2 (two) times daily for 5 days. 08/05/20 08/10/20  Hall-Potvin, [place], PA-C  meloxicam (MOBIC) 7.5 MG tablet Take 1 tablet (7.5 mg total) by mouth daily. 08/05/20   Hall-Potvin, [place], PA-C    Family History Family History  Problem Relation Age of Onset  . Heart disease Father   . Heart attack Father   . Cancer Mother     Social History Social History   Tobacco Use  . Smoking status: Current Every Day Smoker    Packs/day: 0.50    Types: Cigarettes  . Smokeless tobacco: Never Used  Vaping Use  . Vaping Use: Never used  Substance Use Topics  . Alcohol use: Yes    Comment: socially  . Drug use: No     Allergies   Hydrocodone   Review of Systems As per HPI   Physical Exam Triage Vital Signs ED Triage Vitals  Enc Vitals Group     BP      Pulse      Resp      Temp      Temp src      SpO2      Weight      Height      Head Circumference      Peak Flow      Pain Score       Pain Loc      Pain Edu?      Excl. in [place]?    No data found.  Updated Vital Signs BP 122/86 (BP Location: Left Arm)   Pulse (!) 102   Temp 98.3 F (36.8 C) (Oral)   Resp 18   SpO2 98%   Visual Acuity Right Eye Distance:   Left Eye Distance:   Bilateral Distance:    Right Eye Near:   Left Eye Near:    Bilateral Near:     Physical Exam Constitutional:      General: He is not in acute distress. HENT:     Head: Normocephalic and atraumatic.  Eyes:     General: No scleral icterus.    Pupils: Pupils are equal, round, and reactive to light.  Cardiovascular:     Rate and Rhythm: Normal rate.  Pulmonary:     Effort: Pulmonary effort is normal. No respiratory distress.     Breath sounds: No wheezing.  Skin:    Coloration: Skin is not jaundiced or pale.  Comments: Right medial calf with 5x4cm area of erythema.  Not bright, hot, though is tender.  No crepitus or open wound, discharge.  NVI *Per ER note, area of concern previously measured 13x6cm  Neurological:     Mental Status: He is alert and oriented to person, place, and time.    13x6cm  UC Treatments / Results  Labs (all labs ordered are listed, but only abnormal results are displayed) Labs Reviewed  CBC    EKG   Radiology No results found.  Procedures Procedures (including critical care time)  Medications Ordered in UC Medications - No data to display  Initial Impression / Assessment and Plan / UC Course  I have reviewed the triage vital signs and the nursing notes.  Pertinent labs & imaging results that were available during my care of the patient were reviewed by me and considered in my medical decision making (see chart for details).     Patient afebrile, nontoxic, otherwise feels well.  Per ER records, patient treated for cellulitis of right lower extremity.  Doppler negative for DVT.  Given dalbavancin, ancef, and referred to ID.  Blood culture without growth x4 days.  Report status pending.   WBC elevated at 17.2.  Low concern for systemic infection at this time.  Does have ID appointment on Wednesday: Intends to keep it.  Will redraw CBC to trend leukocytosis down and start antibiotics in the interim given pain.  Return precautions discussed, pt verbalized understanding and is agreeable to plan. Final Clinical Impressions(s) / UC Diagnoses   Final diagnoses:  Leukocytosis, unspecified type  Right leg pain     Discharge Instructions     Keep appointment with ID on Wednesday at 9:15am! Take antibiotic 2 times a day with food.    ED Prescriptions    Medication Sig Dispense Auth. Provider   meloxicam (MOBIC) 7.5 MG tablet Take 1 tablet (7.5 mg total) by mouth daily. 14 tablet Hall-Potvin, [place], PA-C   doxycycline (VIBRAMYCIN) 100 MG capsule Take 1 capsule (100 mg total) by mouth 2 (two) times daily for 5 days. 10 capsule Hall-Potvin, [place], PA-C     I have reviewed the PDMP during this encounter.   Hall-Potvin, [place], [place] 08/05/20 1025

## 2020-08-06 DIAGNOSIS — E669 Obesity, unspecified: Secondary | ICD-10-CM | POA: Insufficient documentation

## 2020-08-06 DIAGNOSIS — L03115 Cellulitis of right lower limb: Secondary | ICD-10-CM | POA: Insufficient documentation

## 2020-08-06 LAB — CBC
Hematocrit: 45.9 % (ref 37.5–51.0)
Hemoglobin: 15.6 g/dL (ref 13.0–17.7)
MCH: 32.8 pg (ref 26.6–33.0)
MCHC: 34 g/dL (ref 31.5–35.7)
MCV: 96 fL (ref 79–97)
Platelets: 329 10*3/uL (ref 150–450)
RBC: 4.76 x10E6/uL (ref 4.14–5.80)
RDW: 11.8 % (ref 11.6–15.4)
WBC: 9.9 10*3/uL (ref 3.4–10.8)
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
## 2020-08-07 ENCOUNTER — Ambulatory Visit (INDEPENDENT_AMBULATORY_CARE_PROVIDER_SITE_OTHER): Payer: Self-pay | Admitting: Internal Medicine

## 2020-08-07 ENCOUNTER — Other Ambulatory Visit: Payer: Self-pay

## 2020-08-07 ENCOUNTER — Encounter: Payer: Self-pay | Admitting: Internal Medicine

## 2020-08-07 VITALS — BP 127/85 | HR 105 | Temp 97.7°F | Wt 300.0 lb

## 2020-08-07 DIAGNOSIS — F17200 Nicotine dependence, unspecified, uncomplicated: Secondary | ICD-10-CM

## 2020-08-07 DIAGNOSIS — L03115 Cellulitis of right lower limb: Secondary | ICD-10-CM

## 2020-08-07 DIAGNOSIS — E669 Obesity, unspecified: Secondary | ICD-10-CM

## 2020-08-07 NOTE — Patient Instructions (Signed)
Thank you for coming to see me today. It was a pleasure.   Today we talked about:  -- your cellulitis is healing based on what I can see -- sending you to a vascular specialist and I placed a referral  Your To Do Items: -- Get labs done.  Once we have all the results someone from our clinic will update you   Please follow-up with me in about 1 week.  If you have any questions or concerns, please do not hesitate to call the office at [phone].  Take Care,   Gwynn Burly, DO
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
## 2020-08-08 ENCOUNTER — Telehealth: Payer: Self-pay

## 2020-08-08 LAB — HEMOGLOBIN A1C
Hgb A1c MFr Bld: 5.5 % of total Hgb (ref <5.7)
Mean Plasma Glucose: 111 (calc)
eAG (mmol/L): 6.2 (calc)

## 2020-08-08 NOTE — Telephone Encounter (Signed)
-----   Message from Kathlynn Grate, DO sent at 08/08/2020 11:16 AM EDT ----- Can you please let patient know that his hemoglobin A1c yesterday is normal and does not show evidence of diabetes.   Jodi Geralds

## 2020-08-08 NOTE — Telephone Encounter (Signed)
Called patient to relay the below results per Dr. Earlene Plater. Patient verbalized understanding and has no further questions.   Sandie Ano, RN

## 2020-08-14 ENCOUNTER — Encounter: Payer: Self-pay | Admitting: Internal Medicine

## 2020-08-14 ENCOUNTER — Ambulatory Visit (INDEPENDENT_AMBULATORY_CARE_PROVIDER_SITE_OTHER): Payer: Self-pay | Admitting: Internal Medicine

## 2020-08-14 ENCOUNTER — Other Ambulatory Visit: Payer: Self-pay

## 2020-08-14 DIAGNOSIS — L03115 Cellulitis of right lower limb: Secondary | ICD-10-CM
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
## 2020-08-14 NOTE — Patient Instructions (Signed)
Thank you for coming to see me today. It was a pleasure.   If you notice any worsening of you leg pain or swelling please let us know right away If you feel like there is an abscess developing you should consider going to an urgent care so they can open the area. If things get worse we will need to consider doing a CT scan.  If you have any questions or concerns, please do not hesitate to call the office at [phone].  Take Care,   Gwynn Burly, DO
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
## 2020-08-21 ENCOUNTER — Ambulatory Visit: Payer: Self-pay | Admitting: Internal Medicine

## 2021-01-26 ENCOUNTER — Ambulatory Visit
Admission: EM | Admit: 2021-01-26 | Discharge: 2021-01-26 | Disposition: A | Discharge status: Home / Self Care | Payer: Self-pay | Attending: Physician Assistant | Admitting: Physician Assistant

## 2021-01-26 ENCOUNTER — Ambulatory Visit (INDEPENDENT_AMBULATORY_CARE_PROVIDER_SITE_OTHER): Payer: Self-pay

## 2021-01-26 ENCOUNTER — Other Ambulatory Visit: Payer: Self-pay

## 2021-01-26 DIAGNOSIS — R079 Chest pain, unspecified: Secondary | ICD-10-CM

## 2021-01-26 DIAGNOSIS — R059 Cough, unspecified: Secondary | ICD-10-CM

## 2021-01-26 DIAGNOSIS — B349 Viral infection, unspecified: Secondary | ICD-10-CM

## 2021-01-26 DIAGNOSIS — R509 Fever, unspecified: Secondary | ICD-10-CM

## 2021-01-26 NOTE — ED Triage Notes (Signed)
Pt presents with c/o body aches , fever and chills with c/o left side pain  Just under ribs

## 2021-01-26 NOTE — Discharge Instructions (Addendum)
Return if any problems. Ibuprofen every 4 hours.

## 2021-01-27 ENCOUNTER — Emergency Department (HOSPITAL_COMMUNITY)
Admission: EM | Admit: 2021-01-27 | Discharge: 2021-01-27 | Disposition: A | Discharge status: Home / Self Care | Payer: Self-pay | Attending: Emergency Medicine | Admitting: Emergency Medicine

## 2021-01-27 ENCOUNTER — Encounter (HOSPITAL_COMMUNITY): Payer: Self-pay

## 2021-01-27 ENCOUNTER — Emergency Department (HOSPITAL_COMMUNITY): Payer: Self-pay

## 2021-01-27 DIAGNOSIS — X58XXXA Exposure to other specified factors, initial encounter: Secondary | ICD-10-CM | POA: Insufficient documentation

## 2021-01-27 DIAGNOSIS — J029 Acute pharyngitis, unspecified: Secondary | ICD-10-CM | POA: Insufficient documentation

## 2021-01-27 DIAGNOSIS — R079 Chest pain, unspecified: Secondary | ICD-10-CM | POA: Insufficient documentation

## 2021-01-27 DIAGNOSIS — B370 Candidal stomatitis: Secondary | ICD-10-CM | POA: Insufficient documentation

## 2021-01-27 DIAGNOSIS — S025XXB Fracture of tooth (traumatic), initial encounter for open fracture: Secondary | ICD-10-CM | POA: Insufficient documentation

## 2021-01-27 DIAGNOSIS — F17210 Nicotine dependence, cigarettes, uncomplicated: Secondary | ICD-10-CM | POA: Insufficient documentation

## 2021-01-27 DIAGNOSIS — R609 Edema, unspecified: Secondary | ICD-10-CM | POA: Insufficient documentation

## 2021-01-27 LAB — BASIC METABOLIC PANEL
Anion gap: 8 (ref 5–15)
BUN: 8 mg/dL (ref 6–20)
CO2: 22 mmol/L (ref 22–32)
Calcium: 8.7 mg/dL — ABNORMAL LOW (ref 8.9–10.3)
Chloride: 104 mmol/L (ref 98–111)
Creatinine, Ser: 0.97 mg/dL (ref 0.61–1.24)
GFR, Estimated: >60 mL/min (ref >60)
Glucose, Bld: 128 mg/dL — ABNORMAL HIGH (ref 70–99)
Potassium: 4 mmol/L (ref 3.5–5.1)
Sodium: 134 mmol/L — ABNORMAL LOW (ref 135–145)

## 2021-01-27 LAB — D-DIMER, QUANTITATIVE: D-Dimer, Quant: 0.38 ug/mL-FEU (ref 0.00–0.50)

## 2021-01-27 LAB — TROPONIN I (HIGH SENSITIVITY)
Troponin I (High Sensitivity): 4 ng/L (ref <18)
Troponin I (High Sensitivity): 3 ng/L (ref <18)

## 2021-01-27 LAB — CBC
HCT: 48.7 % (ref 39.0–52.0)
Hemoglobin: 16.3 g/dL (ref 13.0–17.0)
MCH: 32.5 pg (ref 26.0–34.0)
MCHC: 33.5 g/dL (ref 30.0–36.0)
MCV: 97.2 fL (ref 80.0–100.0)
Platelets: 201 10*3/uL (ref 150–400)
RBC: 5.01 MIL/uL (ref 4.22–5.81)
RDW: 13.2 % (ref 11.5–15.5)
WBC: 7.3 10*3/uL (ref 4.0–10.5)
nRBC: 0 % (ref 0.0–0.2)

## 2021-01-27 LAB — HEPATIC FUNCTION PANEL
ALT: 41 U/L (ref 0–44)
AST: 30 U/L (ref 15–41)
Albumin: 4.5 g/dL (ref 3.5–5.0)
Alkaline Phosphatase: 60 U/L (ref 38–126)
Bilirubin, Direct: <0.1 mg/dL (ref 0.0–0.2)
Total Bilirubin: 0.4 mg/dL (ref 0.3–1.2)
Total Protein: 7.8 g/dL (ref 6.5–8.1)

## 2021-01-27 LAB — COVID-19, FLU A+B NAA
Influenza A, NAA: NOT DETECTED
Influenza B, NAA: NOT DETECTED
SARS-CoV-2, NAA: NOT DETECTED

## 2021-01-27 MED ORDER — PENICILLIN V POTASSIUM 500 MG PO TABS: 500.0000 mg | ORAL_TABLET | Freq: Four times a day (QID) | ORAL | 0 refills | Status: AC

## 2021-01-27 MED ORDER — DEXAMETHASONE SODIUM PHOSPHATE 10 MG/ML IJ SOLN
10.0000 mg | Freq: Once | INTRAMUSCULAR | Status: AC
  Administered 2021-01-27: 10 mg via INTRAVENOUS
  Filled 2021-01-27: qty 1

## 2021-01-27 MED ORDER — NYSTATIN 100000 UNIT/ML MT SUSP: 500000.0000 [IU] | Freq: Four times a day (QID) | OROMUCOSAL | 0 refills | Status: AC

## 2021-01-27 NOTE — ED Provider Notes (Signed)
[facility]    CSN: [id number] Arrival date & time: 01/26/21  1037      History   Chief Complaint Chief Complaint  Patient presents with  . Generalized Body Aches    HPI Jared NANCARROW is a 40 y.o. male.   The history is provided by the patient. No language interpreter was used.  Cough Cough characteristics:  Non-productive Severity:  Moderate Onset quality:  Gradual Duration:  2 days Progression:  Worsening Chronicity:  New Smoker: no   Relieved by:  Nothing Ineffective treatments:  None tried Associated symptoms: fever   Risk factors: no recent infection     No past medical history on file.  Patient Active Problem List   Diagnosis Date Noted  . Tobacco use disorder 08/07/2020  . Cellulitis of right leg 08/06/2020  . Obesity (BMI 35.0-39.9 without comorbidity) 08/06/2020    Past Surgical History:  Procedure Laterality Date  . APPENDECTOMY         Home Medications    Prior to Admission medications   Not on File    Family History Family History  Problem Relation Age of Onset  . Heart disease Father   . Heart attack Father   . Cancer Mother     Social History Social History   Tobacco Use  . Smoking status: Current Every Day Smoker    Packs/day: 1.00    Types: Cigarettes  . Smokeless tobacco: Never Used  Vaping Use  . Vaping Use: Never used  Substance Use Topics  . Alcohol use: Yes    Comment: socially  . Drug use: No     Allergies   Hydrocodone   Review of Systems Review of Systems  Constitutional: Positive for fever.  Respiratory: Positive for cough.   All other systems reviewed and are negative.    Physical Exam Triage Vital Signs ED Triage Vitals  Enc Vitals Group     BP 01/26/21 1049 (!) 141/96     Pulse Rate 01/26/21 1049 (!) 105     Resp 01/26/21 1049 20     Temp 01/26/21 1049 98.5 F (36.9 C)     Temp Source 01/26/21 1049 Oral     SpO2 01/26/21 1049 95 %     Weight --      Height --       Head Circumference --      Peak Flow --      Pain Score 01/26/21 1054 4     Pain Loc --      Pain Edu? --      Excl. in GC? --    No data found.  Updated Vital Signs BP (!) 141/96 (BP Location: Left Arm)   Pulse (!) 105   Temp 98.5 F (36.9 C) (Oral)   Resp 20   SpO2 95%   Visual Acuity Right Eye Distance:   Left Eye Distance:   Bilateral Distance:    Right Eye Near:   Left Eye Near:    Bilateral Near:     Physical Exam Vitals and nursing note reviewed.  Constitutional:      Appearance: He is well-developed.  HENT:     Head: Normocephalic and atraumatic.     Nose: Nose normal.     Mouth/Throat:     Mouth: Mucous membranes are moist.  Eyes:     Conjunctiva/sclera: Conjunctivae normal.  Cardiovascular:     Rate and Rhythm: Normal rate and regular rhythm.     Heart sounds: No  murmur heard.   Pulmonary:     Effort: Pulmonary effort is normal. No respiratory distress.     Breath sounds: Normal breath sounds.  Abdominal:     Palpations: Abdomen is soft.  Musculoskeletal:        General: Normal range of motion.     Cervical back: Neck supple.  Skin:    General: Skin is warm and dry.  Neurological:     General: No focal deficit present.     Mental Status: He is alert.  Psychiatric:        Mood and Affect: Mood normal.      UC Treatments / Results  Labs (all labs ordered are listed, but only abnormal results are displayed) Labs Reviewed  COVID-19, FLU A+B NAA    EKG   Radiology DG Chest 2 View  Result Date: 01/26/2021 CLINICAL DATA:  Fever, chills and LEFT chest pain. EXAM: CHEST - 2 VIEW COMPARISON:  06/03/2017 and prior radiographs FINDINGS: The cardiomediastinal silhouette is unremarkable. Mild chronic peribronchial thickening are noted. There is no evidence of focal airspace disease, pulmonary edema, suspicious pulmonary nodule/mass, pleural effusion, or pneumothorax. No acute bony abnormalities are identified. IMPRESSION: No active cardiopulmonary  disease. Electronically Signed   By: Harmon Pier M.D.   On: 01/26/2021 11:29    Procedures Procedures (including critical care time)  Medications Ordered in UC Medications - No data to display  Initial Impression / Assessment and Plan / UC Course  I have reviewed the triage vital signs and the nursing notes.  Pertinent labs & imaging results that were available during my care of the patient were reviewed by me and considered in my medical decision making (see chart for details).     MDM:  Chest xray negative,  I suspect viral illness.  covid pending.  Pt advised tylenol.   Final Clinical Impressions(s) / UC Diagnoses   Final diagnoses:  Cough  Viral illness     Discharge Instructions     Return if any problems. Ibuprofen every 4 hours.     ED Prescriptions    None     PDMP not reviewed this encounter.  An After Visit Summary was printed and given to the patient.    Elson Areas, [place] 01/27/21 1005

## 2021-01-27 NOTE — ED Notes (Signed)
An After Visit Summary was printed and given to the patient. Discharge instructions given and no further questions at this time.  Pt leaving with wife.  

## 2021-01-27 NOTE — ED Triage Notes (Signed)
Pt presents with c/o chest pain, back pain, and tongue pain. Pt reports he has had a spot on his tongue since Thursday and that is has been getting worse. Pt also c/o chest pain that hurts through to his back and when he drinks liquids, his abdomen is hurting.

## 2021-01-27 NOTE — ED Provider Notes (Signed)
[facility]-EMERGENCY DEPT Provider Note   CSN: [id number] Arrival date & time: 01/27/21  1191     History Chief Complaint  Patient presents with  . Chest Pain    LEVELLE EDELEN is a 40 y.o. male.  40yo M who p/w chest pain, body aches, fevers. Pt reports that 4 days ago, he began having sharp, intermittent cramping chest pain associated with cough, body aches, fevers up to 103, and chills. He reports associated stomach pain when he drinks liquids, diarrhea, sore throat, and nasal congestion. He has also noticed spots on his tongue that have progressively worsened. He was seen at [facility] yesterday and had COVID/flu test which is still pending. He had COVID back in January.   He had ibuprofen at 5am.   The history is provided by the patient.  Chest Pain      History reviewed. No pertinent past medical history.  Patient Active Problem List   Diagnosis Date Noted  . Tobacco use disorder 08/07/2020  . Cellulitis of right leg 08/06/2020  . Obesity (BMI 35.0-39.9 without comorbidity) 08/06/2020    Past Surgical History:  Procedure Laterality Date  . APPENDECTOMY         Family History  Problem Relation Age of Onset  . Heart disease Father   . Heart attack Father   . Cancer Mother     Social History   Tobacco Use  . Smoking status: Current Every Day Smoker    Packs/day: 1.00    Types: Cigarettes  . Smokeless tobacco: Never Used  Vaping Use  . Vaping Use: Never used  Substance Use Topics  . Alcohol use: Yes    Comment: socially  . Drug use: No    Home Medications Prior to Admission medications   Medication Sig Start Date End Date Taking? Authorizing Provider  nystatin (MYCOSTATIN) 100000 UNIT/ML suspension Take 5 mLs (500,000 Units total) by mouth 4 (four) times daily for 7 days. 01/27/21 02/03/21 Yes Dashiell Franchino, Ambrose Finland, MD  penicillin v potassium (VEETID) 500 MG tablet Take 1 tablet (500 mg total) by mouth 4 (four) times daily for 7  days. 01/27/21 02/03/21 Yes Majestic Brister, Ambrose Finland, MD    Allergies    Hydrocodone  Review of Systems   Review of Systems  Cardiovascular: Positive for chest pain.  All other systems reviewed and are negative except that which was mentioned in HPI   Physical Exam Updated Vital Signs BP (!) 147/88   Pulse 96   Temp 99.4 F (37.4 C) (Oral)   Resp 14   SpO2 96%   Physical Exam Vitals and nursing note reviewed.  Constitutional:      General: He is not in acute distress.    Appearance: Normal appearance.  HENT:     Head: Normocephalic and atraumatic.     Comments: Moist mucous membranes; white plaque on tongue; ulcer on central tongue and tiny ulcers in posterior oropharynx; tonsils erythematous and symmetrically enlarged, no uvula deviation  R lower molar broken to gumline, tender to percussion, no mucosal swelling Eyes:     Conjunctiva/sclera: Conjunctivae normal.  Cardiovascular:     Rate and Rhythm: Normal rate and regular rhythm.     Heart sounds: Normal heart sounds. No murmur heard.   Pulmonary:     Effort: Pulmonary effort is normal.     Breath sounds: Normal breath sounds.  Abdominal:     General: Abdomen is flat. Bowel sounds are normal. There is no distension.  Palpations: Abdomen is soft.     Tenderness: There is no abdominal tenderness.  Musculoskeletal:     Right lower leg: Edema present.     Left lower leg: No edema.     Comments: Mild R lower leg edema (chronic)  Skin:    General: Skin is warm and dry.  Neurological:     Mental Status: He is alert and oriented to person, place, and time.     Comments: fluent  Psychiatric:        Mood and Affect: Mood normal.        Behavior: Behavior normal.     ED Results / Procedures / Treatments   Labs (all labs ordered are listed, but only abnormal results are displayed) Labs Reviewed  BASIC METABOLIC PANEL - Abnormal; Notable for the following components:      Result Value   Sodium 134 (*)    Glucose,  Bld 128 (*)    Calcium 8.7 (*)    All other components within normal limits  CBC  HEPATIC FUNCTION PANEL  D-DIMER, QUANTITATIVE  TROPONIN I (HIGH SENSITIVITY)  TROPONIN I (HIGH SENSITIVITY)    EKG EKG Interpretation  Date/Time:  Monday January 27 2021 09:53:55 EDT Ventricular Rate:  104 PR Interval:    QRS Duration: 106 QT Interval:  306 QTC Calculation: 403 R Axis:   61 Text Interpretation: Sinus tachycardia Low voltage, precordial leads Abnormal R-wave progression, early transition No significant change since last tracing Confirmed by Frederick Peers [phone]) on 01/27/2021 11:27:36 AM   Radiology DG Chest 2 View  Result Date: 01/27/2021 CLINICAL DATA:  Chest pain EXAM: CHEST - 2 VIEW COMPARISON:  01/26/2021 FINDINGS: The heart size and mediastinal contours are within normal limits. Both lungs are clear. The visualized skeletal structures are unremarkable. IMPRESSION: No acute abnormality of the lungs. Electronically Signed   By: Lauralyn Primes M.D.   On: 01/27/2021 10:15   DG Chest 2 View  Result Date: 01/26/2021 CLINICAL DATA:  Fever, chills and LEFT chest pain. EXAM: CHEST - 2 VIEW COMPARISON:  06/03/2017 and prior radiographs FINDINGS: The cardiomediastinal silhouette is unremarkable. Mild chronic peribronchial thickening are noted. There is no evidence of focal airspace disease, pulmonary edema, suspicious pulmonary nodule/mass, pleural effusion, or pneumothorax. No acute bony abnormalities are identified. IMPRESSION: No active cardiopulmonary disease. Electronically Signed   By: Harmon Pier M.D.   On: 01/26/2021 11:29    Procedures Procedures   Medications Ordered in ED Medications  dexamethasone (DECADRON) injection 10 mg (10 mg Intravenous Given 01/27/21 1108)    ED Course  I have reviewed the triage vital signs and the nursing notes.  Pertinent labs & imaging results that were available during my care of the patient were reviewed by me and considered in my medical  decision making (see chart for details).    MDM Rules/Calculators/A&P                          PT non-toxic on exam, clear breath sounds. Evidence of thrush on tongue but he also has ulcerations that suggest viral illness. Will start on PCN for his tooth; he understands need to see dentist/oral surgeon for extraction.  CXR clear, EKG reassuring, negative serial trops, normal D-dimer making PE unlikely, CMP and CBC normal. Pt well appearing on reassessment. I gave decadron to help with tonsillar swelling/pain, no evidence of PTA or RPA on exam. Given constellation of sx, I strongly suspect viral pharyngitis. He will  await COVID/flu testing from urgent care.  I have discussed supportive measures for his symptoms and reviewed return precautions including any worsening symptoms or new symptoms such as breathing problems.  He voiced understanding. Final Clinical Impression(s) / ED Diagnoses Final diagnoses:  Viral pharyngitis  Oral thrush  Open fracture of tooth, initial encounter    Rx / DC Orders ED Discharge Orders         Ordered    penicillin v potassium (VEETID) 500 MG tablet  4 times daily        01/27/21 1453    nystatin (MYCOSTATIN) 100000 UNIT/ML suspension  4 times daily        01/27/21 1453           Eveline Sauve, Ambrose Finland, MD 01/27/21 1456

## 2022-06-04 ENCOUNTER — Encounter: Payer: Self-pay | Admitting: Emergency Medicine

## 2022-06-04 ENCOUNTER — Ambulatory Visit
Admission: EM | Admit: 2022-06-04 | Discharge: 2022-06-04 | Disposition: A | Discharge status: Home / Self Care | Payer: Commercial Managed Care - HMO | Attending: Internal Medicine | Admitting: Internal Medicine

## 2022-06-04 DIAGNOSIS — J029 Acute pharyngitis, unspecified: Secondary | ICD-10-CM | POA: Diagnosis present

## 2022-06-04 DIAGNOSIS — J069 Acute upper respiratory infection, unspecified: Secondary | ICD-10-CM

## 2022-06-04 DIAGNOSIS — R062 Wheezing: Secondary | ICD-10-CM

## 2022-06-04 LAB — POCT RAPID STREP A (OFFICE): Rapid Strep A Screen: NEGATIVE

## 2022-06-04 MED ORDER — ALBUTEROL SULFATE HFA 108 (90 BASE) MCG/ACT IN AERS: 1.0000 | INHALATION_SPRAY | Freq: Four times a day (QID) | RESPIRATORY_TRACT | 0 refills | Status: AC | PRN

## 2022-06-04 MED ORDER — PREDNISONE 20 MG PO TABS: 40.0000 mg | ORAL_TABLET | Freq: Every day | ORAL | 0 refills | Status: AC

## 2022-06-04 MED ORDER — ALBUTEROL SULFATE (2.5 MG/3ML) 0.083% IN NEBU
2.5000 mg | INHALATION_SOLUTION | Freq: Once | RESPIRATORY_TRACT | Status: AC
  Administered 2022-06-04: 2.5 mg via RESPIRATORY_TRACT

## 2022-06-04 NOTE — Discharge Instructions (Signed)
It appears that you have a viral infection that should run its course and self resolve with symptomatic treatment.  You do have wheezing so prednisone and albuterol inhaler has been prescribed for you to take.  Please follow-up if symptoms persist or worsen.  COVID test is pending.  We will call if it is positive.

## 2022-06-04 NOTE — ED Triage Notes (Signed)
Pt is present today with c/o nasal congestion, sinus pressure, and cough. Pt sx started x3 days ago

## 2022-06-04 NOTE — ED Provider Notes (Signed)
[facility]    CSN: [id number] Arrival date & time: 06/04/22  1157      History   Chief Complaint Chief Complaint  Patient presents with   Nasal Congestion   Facial Pain    HPI Jared Humphrey is a 41 y.o. male.   Patient presents with nasal congestion, cough, sinus pressure that started about 2 to 3 days ago.  Denies any known sick contacts or fever.  Denies chest pain, shortness of breath, ear pain, nausea, vomiting, diarrhea, abdominal pain.  Patient reports that "his left tonsil hurts upon awakening in the morning" but denies obvious sore throat.  He has not taken any medications to alleviate symptoms.  Denies history of asthma.     History reviewed. No pertinent past medical history.  Patient Active Problem List   Diagnosis Date Noted   Tobacco use disorder 08/07/2020   Cellulitis of right leg 08/06/2020   Obesity (BMI 35.0-39.9 without comorbidity) 08/06/2020    Past Surgical History:  Procedure Laterality Date   APPENDECTOMY         Home Medications    Prior to Admission medications   Medication Sig Start Date End Date Taking? Authorizing Provider  albuterol (VENTOLIN HFA) 108 (90 Base) MCG/ACT inhaler Inhale 1-2 puffs into the lungs every 6 (six) hours as needed for wheezing or shortness of breath. 06/04/22  Yes Nataley Bahri, Rolly Salter E, FNP  predniSONE (DELTASONE) 20 MG tablet Take 2 tablets (40 mg total) by mouth daily for 5 days. 06/04/22 06/09/22 Yes Amnah Breuer, Acie Fredrickson, FNP    Family History Family History  Problem Relation Age of Onset   Heart disease Father    Heart attack Father    Cancer Mother     Social History Social History   Tobacco Use   Smoking status: Every Day    Packs/day: 1.00    Types: Cigarettes   Smokeless tobacco: Never  Vaping Use   Vaping Use: Never used  Substance Use Topics   Alcohol use: Yes    Comment: socially   Drug use: No     Allergies   Hydrocodone   Review of Systems Review of Systems Per  HPI  Physical Exam Triage Vital Signs ED Triage Vitals  Enc Vitals Group     BP 06/04/22 1231 110/75     Pulse Rate 06/04/22 1231 97     Resp 06/04/22 1231 17     Temp 06/04/22 1231 98.4 F (36.9 C)     Temp src --      SpO2 06/04/22 1231 93 %     Weight --      Height --      Head Circumference --      Peak Flow --      Pain Score 06/04/22 1230 4     Pain Loc --      Pain Edu? --      Excl. in GC? --    No data found.  Updated Vital Signs BP 110/75   Pulse 97   Temp 98.4 F (36.9 C)   Resp 17   SpO2 98%   Visual Acuity Right Eye Distance:   Left Eye Distance:   Bilateral Distance:    Right Eye Near:   Left Eye Near:    Bilateral Near:     Physical Exam Constitutional:      General: He is not in acute distress.    Appearance: Normal appearance. He is not toxic-appearing or diaphoretic.  HENT:  Head: Normocephalic and atraumatic.     Right Ear: Tympanic membrane and ear canal normal.     Left Ear: Tympanic membrane and ear canal normal.     Nose: Congestion present.     Mouth/Throat:     Mouth: Mucous membranes are moist.     Pharynx: Posterior oropharyngeal erythema present. No pharyngeal swelling, oropharyngeal exudate or uvula swelling.     Tonsils: No tonsillar exudate or tonsillar abscesses.  Eyes:     Extraocular Movements: Extraocular movements intact.     Conjunctiva/sclera: Conjunctivae normal.     Pupils: Pupils are equal, round, and reactive to light.  Cardiovascular:     Rate and Rhythm: Normal rate and regular rhythm.     Pulses: Normal pulses.     Heart sounds: Normal heart sounds.  Pulmonary:     Effort: Pulmonary effort is normal. No respiratory distress.     Breath sounds: No stridor. Wheezing present. No rhonchi or rales.  Abdominal:     General: Abdomen is flat. Bowel sounds are normal.     Palpations: Abdomen is soft.  Musculoskeletal:        General: Normal range of motion.     Cervical back: Normal range of motion.   Skin:    General: Skin is warm and dry.  Neurological:     General: No focal deficit present.     Mental Status: He is alert and oriented to person, place, and time. Mental status is at baseline.  Psychiatric:        Mood and Affect: Mood normal.        Behavior: Behavior normal.      UC Treatments / Results  Labs (all labs ordered are listed, but only abnormal results are displayed) Labs Reviewed  CULTURE, GROUP A STREP ([facility])  NOVEL CORONAVIRUS, NAA  POCT RAPID STREP A (OFFICE)    EKG   Radiology No results found.  Procedures Procedures (including critical care time)  Medications Ordered in UC Medications  albuterol (PROVENTIL) (2.5 MG/3ML) 0.083% nebulizer solution 2.5 mg (2.5 mg Nebulization Given 06/04/22 1302)    Initial Impression / Assessment and Plan / UC Course  I have reviewed the triage vital signs and the nursing notes.  Pertinent labs & imaging results that were available during my care of the patient were reviewed by me and considered in my medical decision making (see chart for details).     Patient presents with symptoms likely from a viral upper respiratory infection. Differential includes bacterial pneumonia, sinusitis, allergic rhinitis, COVID-19. Patient is nontoxic appearing and not in need of emergent medical intervention.  Strep was negative.  Throat culture and COVID test pending.  Patient had wheezing on exam.  Albuterol nebulizer treatment administered in urgent care with improvement in oxygen, patient stating he felt better, and improvement in wheezing.  Vital signs and patient's oxygen stable at discharge.  Safe for discharge.  Do not think chest imaging is necessary at this time.  Recommended symptom control with over the counter medications.  Will prescribe prednisone and albuterol inhaler given sinus pressure and wheezing noted on exam.  Return if symptoms fail to improve in 1-2 weeks or you develop shortness of breath, chest pain,  severe headache. Patient states understanding and is agreeable.  Discharged with PCP followup.  Final Clinical Impressions(s) / UC Diagnoses   Final diagnoses:  Viral upper respiratory tract infection with cough  Wheezing  Sore throat     Discharge Instructions  It appears that you have a viral infection that should run its course and self resolve with symptomatic treatment.  You do have wheezing so prednisone and albuterol inhaler has been prescribed for you to take.  Please follow-up if symptoms persist or worsen.  COVID test is pending.  We will call if it is positive.    ED Prescriptions     Medication Sig Dispense Auth. Provider   albuterol (VENTOLIN HFA) 108 (90 Base) MCG/ACT inhaler Inhale 1-2 puffs into the lungs every 6 (six) hours as needed for wheezing or shortness of breath. 1 each [place], Rolly Salter E, [place]   predniSONE (DELTASONE) 20 MG tablet Take 2 tablets (40 mg total) by mouth daily for 5 days. 10 tablet Gustavus Bryant, [place]      PDMP not reviewed this encounter.   Gustavus Bryant, [place] 06/04/22 1325

## 2022-06-05 LAB — NOVEL CORONAVIRUS, NAA: SARS-CoV-2, NAA: NOT DETECTED

## 2022-06-06 LAB — CULTURE, GROUP A STREP (THRC)

## 2022-06-07 ENCOUNTER — Ambulatory Visit
Admission: EM | Admit: 2022-06-07 | Discharge: 2022-06-07 | Disposition: A | Discharge status: Home / Self Care | Payer: Commercial Managed Care - HMO | Attending: Physician Assistant | Admitting: Physician Assistant

## 2022-06-07 ENCOUNTER — Encounter: Payer: Self-pay | Admitting: Emergency Medicine

## 2022-06-07 ENCOUNTER — Other Ambulatory Visit: Payer: Self-pay

## 2022-06-07 DIAGNOSIS — J0100 Acute maxillary sinusitis, unspecified: Secondary | ICD-10-CM

## 2022-06-07 MED ORDER — AMOXICILLIN-POT CLAVULANATE 875-125 MG PO TABS: 1.0000 | ORAL_TABLET | Freq: Two times a day (BID) | ORAL | 0 refills | Status: DC

## 2022-06-07 NOTE — ED Provider Notes (Signed)
[facility]    CSN: [id number] Arrival date & time: 06/07/22  1041      History   Chief Complaint Chief Complaint  Patient presents with   Nasal Congestion    HPI Jared Humphrey is a 41 y.o. male.   Patient here today for evaluation of continued nasal congestion and sinus pressure since being seen for suspected URI a few days ago.  He has not had symptoms for about a week and notes that sinus pressure has changed from unilateral to now bilateral.  He has not had fever.  He denies any sore throat.  He has not had cough.  COVID and strep screening was negative from last office visit.  He states he was taking steroids as prescribed without significant relief.  He does have history of sinus infections and states current symptoms are similar.  The history is provided by the patient.    History reviewed. No pertinent past medical history.  Patient Active Problem List   Diagnosis Date Noted   Tobacco use disorder 08/07/2020   Cellulitis of right leg 08/06/2020   Obesity (BMI 35.0-39.9 without comorbidity) 08/06/2020    Past Surgical History:  Procedure Laterality Date   APPENDECTOMY         Home Medications    Prior to Admission medications   Medication Sig Start Date End Date Taking? Authorizing Provider  amoxicillin-clavulanate (AUGMENTIN) 875-125 MG tablet Take 1 tablet by mouth every 12 (twelve) hours. 06/07/22  Yes Tomi Bamberger, PA-C  albuterol (VENTOLIN HFA) 108 (90 Base) MCG/ACT inhaler Inhale 1-2 puffs into the lungs every 6 (six) hours as needed for wheezing or shortness of breath. 06/04/22   Gustavus Bryant, FNP  predniSONE (DELTASONE) 20 MG tablet Take 2 tablets (40 mg total) by mouth daily for 5 days. 06/04/22 06/09/22  Gustavus Bryant, FNP    Family History Family History  Problem Relation Age of Onset   Heart disease Father    Heart attack Father    Cancer Mother     Social History Social History   Tobacco Use   Smoking status: Every Day     Packs/day: 1.00    Types: Cigarettes   Smokeless tobacco: Never  Vaping Use   Vaping Use: Never used  Substance Use Topics   Alcohol use: Yes    Comment: socially   Drug use: No     Allergies   Hydrocodone   Review of Systems Review of Systems  Constitutional:  Negative for chills and fever.  HENT:  Positive for congestion and sinus pressure. Negative for ear pain and sore throat.   Eyes:  Negative for discharge and redness.  Respiratory:  Negative for cough and shortness of breath.   Gastrointestinal:  Negative for diarrhea, nausea and vomiting.     Physical Exam Triage Vital Signs ED Triage Vitals  Enc Vitals Group     BP      Pulse      Resp      Temp      Temp src      SpO2      Weight      Height      Head Circumference      Peak Flow      Pain Score      Pain Loc      Pain Edu?      Excl. in GC?    No data found.  Updated Vital Signs BP 124/82 (BP Location:  Left Arm)   Pulse 82   Resp 18   SpO2 95%      Physical Exam Vitals and nursing note reviewed.  Constitutional:      General: He is not in acute distress.    Appearance: Normal appearance. He is not ill-appearing.  HENT:     Head: Normocephalic and atraumatic.     Nose: Congestion present.  Eyes:     Conjunctiva/sclera: Conjunctivae normal.  Cardiovascular:     Rate and Rhythm: Normal rate.  Pulmonary:     Effort: Pulmonary effort is normal.  Neurological:     Mental Status: He is alert.  Psychiatric:        Mood and Affect: Mood normal.        Behavior: Behavior normal.        Thought Content: Thought content normal.      UC Treatments / Results  Labs (all labs ordered are listed, but only abnormal results are displayed) Labs Reviewed - No data to display  EKG   Radiology No results found.  Procedures Procedures (including critical care time)  Medications Ordered in UC Medications - No data to display  Initial Impression / Assessment and Plan / UC Course  I  have reviewed the triage vital signs and the nursing notes.  Pertinent labs & imaging results that were available during my care of the patient were reviewed by me and considered in my medical decision making (see chart for details).    Suspect likely sinusitis and will treat with Augmentin.  Encouraged follow-up if symptoms fail to improve or worsen.  Final Clinical Impressions(s) / UC Diagnoses   Final diagnoses:  Acute maxillary sinusitis, recurrence not specified   Discharge Instructions   None    ED Prescriptions     Medication Sig Dispense Auth. Provider   amoxicillin-clavulanate (AUGMENTIN) 875-125 MG tablet Take 1 tablet by mouth every 12 (twelve) hours. 14 tablet Tomi Bamberger, PA-C      PDMP not reviewed this encounter.   Tomi Bamberger, PA-C 06/07/22 1220

## 2022-08-31 ENCOUNTER — Encounter: Payer: Self-pay | Admitting: Emergency Medicine

## 2022-08-31 ENCOUNTER — Ambulatory Visit
Admission: EM | Admit: 2022-08-31 | Discharge: 2022-08-31 | Disposition: A | Discharge status: Home / Self Care | Payer: Commercial Managed Care - HMO | Attending: Physician Assistant | Admitting: Physician Assistant

## 2022-08-31 DIAGNOSIS — J320 Chronic maxillary sinusitis: Secondary | ICD-10-CM

## 2022-08-31 MED ORDER — DOXYCYCLINE HYCLATE 100 MG PO CAPS: 100.0000 mg | ORAL_CAPSULE | Freq: Two times a day (BID) | ORAL | 0 refills | Status: DC

## 2022-08-31 NOTE — Discharge Instructions (Signed)
Advised take the doxycycline twice daily until completed which is a prolonged course being 21 days. Advised take ibuprofen or Tylenol as needed for pain relief.  An internal referral has been made to: [facility].  Their office should be calling you within the next 48 hours to arrange an appointment to be evaluated for the chronic sinusitis.  Follow-up PCP or return to urgent care if symptoms fail to improve.

## 2022-08-31 NOTE — ED Triage Notes (Addendum)
Pt is present today with nasal congestion. Pt states that he has a reoccurring infection for a while. Pt states that he also feels a burning sensation in his nose and watery eyes.

## 2022-08-31 NOTE — ED Provider Notes (Signed)
[facility]    CSN: [id number] Arrival date & time: 08/31/22  1213      History   Chief Complaint Chief Complaint  Patient presents with   Nasal Congestion    HPI Jared Humphrey is a 41 y.o. male.   41 year old male presents with chronic sinus infection.  Patient indicates for the past year he has been having recurrent sinus infections, sinus pain and pressure.  Patient indicates he just finished a course of Augmentin and about a week later he indicates that the pain pressure and discomfort has returned.  Patient indicates that he has maxillary sinus pain and pressure, he also indicates that he has teeth pain but he does indicate that he is got some bad teeth.  Patient also indicates that his sinuses smells like "a dead person".  He relates that it so bad that his wife makes him sleep in another room due to the foul smell of the sinuses.  Patient indicates he has occasional headaches associated with the sinus congestion but he has a headaches intermittently on a regular basis.  Patient indicates he has not have any fever or chills.  He also indicates he has taken a course of doxycycline and that did not help either.  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  Patient does indicate that he smokes 1 to 2 packs a day and has been doing so for the past 25 years.     History reviewed. No pertinent past medical history.  Patient Active Problem List   Diagnosis Date Noted   Tobacco use disorder 08/07/2020   Cellulitis of right leg 08/06/2020   Obesity (BMI 35.0-39.9 without comorbidity) 08/06/2020    Past Surgical History:  Procedure Laterality Date   APPENDECTOMY         Home Medications    Prior to Admission medications   Medication Sig Start Date End Date Taking? Authorizing Provider   doxycycline (VIBRAMYCIN) 100 MG capsule Take 1 capsule (100 mg total) by mouth 2 (two) times daily. 08/31/22  Yes Ellsworth Lennox, PA-C  albuterol (VENTOLIN HFA) 108 (90 Base) MCG/ACT inhaler Inhale 1-2 puffs into the lungs every 6 (six) hours as needed for wheezing or shortness of breath. 06/04/22   Gustavus Bryant, FNP  amoxicillin-clavulanate (AUGMENTIN) 875-125 MG tablet Take 1 tablet by mouth every 12 (twelve) hours. 06/07/22   Tomi Bamberger, PA-C    Family History Family History  Problem Relation Age of Onset   Heart disease Father    Heart attack Father    Cancer Mother     Social History Social History   Tobacco Use   Smoking status: Every Day    Packs/day: 1.00    Types: Cigarettes   Smokeless tobacco: Never  Vaping Use   Vaping Use: Never used  Substance Use Topics   Alcohol use: Yes    Comment: socially   Drug use: No     Allergies   Hydrocodone   Review of Systems Review of Systems  HENT:  Positive for postnasal drip, sinus pressure and sinus pain.      Physical Exam Triage Vital Signs ED Triage Vitals  Enc Vitals Group     BP 08/31/22 1429 125/85     Pulse Rate 08/31/22 1429 81     Resp 08/31/22 1429 18  Temp 08/31/22 1429 97.8 F (36.6 C)     Temp src --      SpO2 08/31/22 1429 95 %     Weight --      Height --      Head Circumference --      Peak Flow --      Pain Score 08/31/22 1428 4     Pain Loc --      Pain Edu? --      Excl. in [place]? --    No data found.  Updated Vital Signs BP 125/85   Pulse 81   Temp 97.8 F (36.6 C)   Resp 18   SpO2 95%   Visual Acuity Right Eye Distance:   Left Eye Distance:   Bilateral Distance:    Right Eye Near:   Left Eye Near:    Bilateral Near:     Physical Exam Constitutional:      Appearance: Normal appearance.  HENT:     Right Ear: Tympanic membrane and ear canal normal.     Left Ear: Tympanic membrane and ear canal normal.     Mouth/Throat:     Mouth: Mucous membranes are moist.      Pharynx: Oropharynx is clear.  Cardiovascular:     Rate and Rhythm: Normal rate and regular rhythm.     Heart sounds: Normal heart sounds.  Pulmonary:     Effort: Pulmonary effort is normal.     Breath sounds: Normal breath sounds and air entry. No wheezing, rhonchi or rales.  Lymphadenopathy:     Cervical: No cervical adenopathy.  Neurological:     Mental Status: He is alert.      UC Treatments / Results  Labs (all labs ordered are listed, but only abnormal results are displayed) Labs Reviewed - No data to display  EKG   Radiology No results found.  Procedures Procedures (including critical care time)  Medications Ordered in UC Medications - No data to display  Initial Impression / Assessment and Plan / UC Course  I have reviewed the triage vital signs and the nursing notes.  Pertinent labs & imaging results that were available during my care of the patient were reviewed by me and considered in my medical decision making (see chart for details).    Plan: 1.  The chronic maxillary sinusitis will be treated with the following: A.  Doxycycline 100 mg twice daily for extended period of 21 days to treat the infection. B.  Patient advised to decrease the amount of smoking as this aggravates the sinus condition. C.  Internal referral has been made to [facility] for evaluation of the sinus problems. 2.  Patient advised follow-up PCP or return to urgent care if symptoms fail to improve. Final Clinical Impressions(s) / UC Diagnoses   Final diagnoses:  Chronic maxillary sinusitis     Discharge Instructions      Advised take the doxycycline twice daily until completed which is a prolonged course being 21 days. Advised take ibuprofen or Tylenol as needed for pain relief.  An internal referral has been made to: [facility].  Their office should be calling you within the next 48 hours to arrange an appointment to be evaluated for the chronic sinusitis.  Follow-up PCP  or return to urgent care if symptoms fail to improve.    ED Prescriptions     Medication Sig Dispense Auth. Provider   doxycycline (VIBRAMYCIN) 100 MG capsule Take 1 capsule (100 mg total)  by mouth 2 (two) times daily. 42 capsule Ellsworth Lennox, PA-C      PDMP not reviewed this encounter.   Ellsworth Lennox, PA-C 08/31/22 1452

## 2023-04-17 ENCOUNTER — Ambulatory Visit
Admission: EM | Admit: 2023-04-17 | Discharge: 2023-04-17 | Disposition: A | Discharge status: Home / Self Care | Payer: Commercial Managed Care - HMO | Attending: Internal Medicine | Admitting: Internal Medicine

## 2023-04-17 DIAGNOSIS — J069 Acute upper respiratory infection, unspecified: Secondary | ICD-10-CM

## 2023-04-17 MED ORDER — DOXYCYCLINE HYCLATE 100 MG PO CAPS: 100.0000 mg | ORAL_CAPSULE | Freq: Two times a day (BID) | ORAL | 0 refills | Status: AC

## 2023-04-17 NOTE — ED Provider Notes (Signed)
[facility]    CSN: [id number] Arrival date & time: 04/17/23  1203      History   Chief Complaint Chief Complaint  Patient presents with   Nasal Congestion    HPI Jared Humphrey is a 42 y.o. male.   Patient presents with 4-day history of runny nose with green nasal drainage.  He reports that he has has a cough but it has been present since having the flu a few weeks ago.  He denies any fever or known sick contacts.  Patient has not taken any medications to help alleviate symptoms.  Patient states that he has a history of recurrent sinus infections and this feels similar.  He has never seen ENT for it.     History reviewed. No pertinent past medical history.  Patient Active Problem List   Diagnosis Date Noted   Tobacco use disorder 08/07/2020   Cellulitis of right leg 08/06/2020   Obesity (BMI 35.0-39.9 without comorbidity) 08/06/2020    Past Surgical History:  Procedure Laterality Date   APPENDECTOMY         Home Medications    Prior to Admission medications   Medication Sig Start Date End Date Taking? Authorizing Provider  doxycycline (VIBRAMYCIN) 100 MG capsule Take 1 capsule (100 mg total) by mouth 2 (two) times daily for 7 days. 04/17/23 04/24/23 Yes Lorilee Cafarella, Acie Fredrickson, FNP  albuterol (VENTOLIN HFA) 108 (90 Base) MCG/ACT inhaler Inhale 1-2 puffs into the lungs every 6 (six) hours as needed for wheezing or shortness of breath. 06/04/22   Gustavus Bryant, FNP    Family History Family History  Problem Relation Age of Onset   Heart disease Father    Heart attack Father    Cancer Mother     Social History Social History   Tobacco Use   Smoking status: Every Day    Packs/day: 1    Types: Cigarettes   Smokeless tobacco: Never  Vaping Use   Vaping Use: Never used  Substance Use Topics   Alcohol use: Yes    Comment: socially   Drug use: No     Allergies   Hydrocodone   Review of Systems Review of Systems Per HPI  Physical  Exam Triage Vital Signs ED Triage Vitals [04/17/23 1213]  Enc Vitals Group     BP (!) 140/79     Pulse Rate 76     Resp 16     Temp 97.7 F (36.5 C)     Temp Source Oral     SpO2 96 %     Weight      Height      Head Circumference      Peak Flow      Pain Score 0     Pain Loc      Pain Edu?      Excl. in [place]?    No data found.  Updated Vital Signs BP (!) 140/79 (BP Location: Left Arm)   Pulse 76   Temp 97.7 F (36.5 C) (Oral)   Resp 16   SpO2 96%   Visual Acuity Right Eye Distance:   Left Eye Distance:   Bilateral Distance:    Right Eye Near:   Left Eye Near:    Bilateral Near:     Physical Exam Constitutional:      General: He is not in acute distress.    Appearance: Normal appearance. He is not toxic-appearing or diaphoretic.  HENT:     Head:  Normocephalic and atraumatic.     Right Ear: Tympanic membrane and ear canal normal.     Left Ear: Tympanic membrane and ear canal normal.     Nose: Congestion present.     Mouth/Throat:     Mouth: Mucous membranes are moist.     Pharynx: No posterior oropharyngeal erythema.  Eyes:     Extraocular Movements: Extraocular movements intact.     Conjunctiva/sclera: Conjunctivae normal.     Pupils: Pupils are equal, round, and reactive to light.  Cardiovascular:     Rate and Rhythm: Normal rate and regular rhythm.     Pulses: Normal pulses.     Heart sounds: Normal heart sounds.  Pulmonary:     Effort: Pulmonary effort is normal. No respiratory distress.     Breath sounds: Normal breath sounds. No stridor. No wheezing, rhonchi or rales.  Abdominal:     General: Abdomen is flat. Bowel sounds are normal.     Palpations: Abdomen is soft.  Musculoskeletal:        General: Normal range of motion.     Cervical back: Normal range of motion.  Skin:    General: Skin is warm and dry.  Neurological:     General: No focal deficit present.     Mental Status: He is alert and oriented to person, place, and time. Mental status  is at baseline.  Psychiatric:        Mood and Affect: Mood normal.        Behavior: Behavior normal.      UC Treatments / Results  Labs (all labs ordered are listed, but only abnormal results are displayed) Labs Reviewed - No data to display  EKG   Radiology No results found.  Procedures Procedures (including critical care time)  Medications Ordered in UC Medications - No data to display  Initial Impression / Assessment and Plan / UC Course  I have reviewed the triage vital signs and the nursing notes.  Pertinent labs & imaging results that were available during my care of the patient were reviewed by me and considered in my medical decision making (see chart for details).     Cough is most likely postviral from flu and there are no adventitious lung sounds on exam so do not think that chest imaging is necessary.  In relation to runny nose, discussed with patient that given duration of symptoms, this is not concerning for sinus infection or need for antibiotic therapy especially given that he has not tried any over-the-counter medications just yet for symptom management.  Patient was adamant that he needed antibiotic therapy given that he has a history of recurrent sinus infections.  I had a long discussion with patient about antibiotic overuse and when antibiotic therapy is appropriate.  Discussed with patient that this could be viral in nature and that we need to attempt symptomatic care prior to initiating antibiotic therapy.  Patient was again adamant that he needed antibiotics given history of recurrent sinus infections and stating that this feels similar and that he did not want have to come back in a few days if symptoms are persistent.  Therefore, will opt to treat with doxycycline to treat upper respiratory infection.  Although, encouraged symptomatic and supportive care.  Encouraged patient to follow-up with provided contact information for ENT specialist given he reports  that this is a recurrent issue for him.  Patient verbalized understanding and is agreeable with plan. Final Clinical Impressions(s) / UC Diagnoses   Final  diagnoses:  Acute upper respiratory infection     Discharge Instructions      I have prescribed you an antibiotic.  Follow-up with ear, nose, throat specialist.    ED Prescriptions     Medication Sig Dispense Auth. Provider   doxycycline (VIBRAMYCIN) 100 MG capsule Take 1 capsule (100 mg total) by mouth 2 (two) times daily for 7 days. 14 capsule [place], Acie Fredrickson, [place]      PDMP not reviewed this encounter.   Gustavus Bryant, [place] 04/17/23 1251

## 2023-04-17 NOTE — Discharge Instructions (Signed)
I have prescribed you an antibiotic.  Follow-up with ear, nose, throat specialist.

## 2023-04-17 NOTE — ED Triage Notes (Signed)
Pt states congestion runny,stuffy nose for the past 4 days.
